# Patient Record
Sex: Female | Born: 1985 | Race: Black or African American | Hispanic: No | Marital: Single | Smoking: Current every day smoker
Health system: Southern US, Community
[De-identification: ages and names within clinical notes are randomized; demographics above are authoritative.]

## PROBLEM LIST (undated history)

## (undated) DIAGNOSIS — G43909 Migraine, unspecified, not intractable, without status migrainosus: Secondary | ICD-10-CM

---
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
## 2007-06-16 ENCOUNTER — Emergency Department (HOSPITAL_COMMUNITY): Admission: EM | Admit: 2007-06-16 | Discharge: 2007-06-16 | Payer: Self-pay | Admitting: Emergency Medicine

## 2007-08-04 ENCOUNTER — Emergency Department (HOSPITAL_COMMUNITY): Admission: EM | Admit: 2007-08-04 | Discharge: 2007-08-04 | Payer: Self-pay | Admitting: Emergency Medicine

## 2009-05-29 ENCOUNTER — Emergency Department (HOSPITAL_COMMUNITY): Admission: EM | Admit: 2009-05-29 | Discharge: 2009-05-29 | Payer: Self-pay | Admitting: Emergency Medicine

## 2010-03-08 ENCOUNTER — Encounter: Payer: Self-pay | Admitting: Obstetrics & Gynecology

## 2010-03-08 ENCOUNTER — Ambulatory Visit: Admit: 2010-03-08 | Payer: Self-pay | Admitting: Obstetrics & Gynecology

## 2010-09-02 ENCOUNTER — Emergency Department (HOSPITAL_COMMUNITY)
Admission: EM | Admit: 2010-09-02 | Discharge: 2010-09-02 | Disposition: A | Discharge status: Home / Self Care | Payer: Self-pay | Attending: Emergency Medicine | Admitting: Emergency Medicine

## 2010-09-02 ENCOUNTER — Emergency Department (HOSPITAL_COMMUNITY): Payer: Self-pay

## 2010-09-02 DIAGNOSIS — R05 Cough: Secondary | ICD-10-CM | POA: Insufficient documentation

## 2010-09-02 DIAGNOSIS — R059 Cough, unspecified: Secondary | ICD-10-CM | POA: Insufficient documentation

## 2010-09-02 DIAGNOSIS — R109 Unspecified abdominal pain: Secondary | ICD-10-CM | POA: Insufficient documentation

## 2010-09-02 DIAGNOSIS — N390 Urinary tract infection, site not specified: Secondary | ICD-10-CM | POA: Insufficient documentation

## 2010-09-02 DIAGNOSIS — R0602 Shortness of breath: Secondary | ICD-10-CM | POA: Insufficient documentation

## 2010-09-02 LAB — URINE MICROSCOPIC-ADD ON

## 2010-09-02 LAB — CBC
MCV: 87 fL (ref 78.0–100.0)
Platelets: 352 10*3/uL (ref 150–400)
RBC: 4.68 MIL/uL (ref 3.87–5.11)
RDW: 13.5 % (ref 11.5–15.5)
WBC: 13.8 10*3/uL — ABNORMAL HIGH (ref 4.0–10.5)

## 2010-09-02 LAB — URINALYSIS, ROUTINE W REFLEX MICROSCOPIC
Protein, ur: NEGATIVE mg/dL
Urobilinogen, UA: 1 mg/dL (ref 0.0–1.0)

## 2010-09-03 LAB — URINE CULTURE
Colony Count: 9000
Culture  Setup Time: 201207291757

## 2010-10-06 ENCOUNTER — Emergency Department (HOSPITAL_COMMUNITY)
Admission: EM | Admit: 2010-10-06 | Discharge: 2010-10-06 | Disposition: A | Discharge status: Home / Self Care | Payer: Self-pay | Attending: Emergency Medicine | Admitting: Emergency Medicine

## 2010-10-06 ENCOUNTER — Emergency Department (HOSPITAL_COMMUNITY): Payer: Self-pay

## 2010-10-06 DIAGNOSIS — M79609 Pain in unspecified limb: Secondary | ICD-10-CM | POA: Insufficient documentation

## 2010-10-06 DIAGNOSIS — IMO0002 Reserved for concepts with insufficient information to code with codable children: Secondary | ICD-10-CM | POA: Insufficient documentation

## 2010-10-06 DIAGNOSIS — S92309A Fracture of unspecified metatarsal bone(s), unspecified foot, initial encounter for closed fracture: Secondary | ICD-10-CM | POA: Insufficient documentation

## 2010-10-06 DIAGNOSIS — S9030XA Contusion of unspecified foot, initial encounter: Secondary | ICD-10-CM | POA: Insufficient documentation

## 2015-04-15 ENCOUNTER — Encounter (HOSPITAL_COMMUNITY): Payer: Self-pay | Admitting: Adult Health

## 2015-04-15 ENCOUNTER — Emergency Department (HOSPITAL_COMMUNITY)
Admission: EM | Admit: 2015-04-15 | Discharge: 2015-04-15 | Disposition: A | Discharge status: Home / Self Care | Payer: Managed Care, Other (non HMO) | Attending: Emergency Medicine | Admitting: Emergency Medicine

## 2015-04-15 DIAGNOSIS — K1379 Other lesions of oral mucosa: Secondary | ICD-10-CM | POA: Insufficient documentation

## 2015-04-15 DIAGNOSIS — J029 Acute pharyngitis, unspecified: Secondary | ICD-10-CM | POA: Diagnosis not present

## 2015-04-15 DIAGNOSIS — K1370 Unspecified lesions of oral mucosa: Secondary | ICD-10-CM

## 2015-04-15 DIAGNOSIS — R600 Localized edema: Secondary | ICD-10-CM | POA: Insufficient documentation

## 2015-04-15 LAB — RAPID STREP SCREEN (MED CTR MEBANE ONLY): Streptococcus, Group A Screen (Direct): NEGATIVE

## 2015-04-15 MED ORDER — ACYCLOVIR 400 MG PO TABS: 400.0000 mg | ORAL_TABLET | Freq: Three times a day (TID) | ORAL | Status: DC

## 2015-04-15 NOTE — ED Notes (Signed)
Presents with sore throat, pain in lips and sores on inside of lips began today-took excedrin for pain with relief. 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 I also rubbed some alcohol on my lips to kill the germs"

## 2015-04-15 NOTE — ED Provider Notes (Signed)
CSN: [id number]     Arrival date & time 04/15/15  1859 History  By signing my name below, I, Bethel Born, attest that this documentation has been prepared under the direction and in the presence of Isel Skufca PA-C. Electronically Signed: Bethel Born, ED Scribe. 04/15/2015 8:49 PM    Chief Complaint  Patient presents with  . Sore Throat   The history is provided by the patient. No language interpreter was used.   Sonya Ruiz is a 30 y.o. female who presents to the Emergency Department complaining of a constant bilateral sore throat with onset this morning upon waking. The pain is worse with swallowing. Associated symptoms include subjective fever, chills, lip swelling, burning lip lesions, and hoarse voice. She reports clusters of small bumps at the corners of her mouth and sensation of ulcers on the inside of her lips. She states "you can't see them but I can feel they are there". States that she put rubbing alcohol on her lips with some relief in swelling. She denies difficulty swallowing or handling secretions. She Googled her symptoms and is concerned for HSV since she performed unprotected oral sex two weeks ago. Her partner denies having Herpes. No known sick contacts with similar symptoms. She does not want other STD testing today. Denies abdominal pain, nausea, vomiting, vaginal discharge or dysuria.   History reviewed. No pertinent past medical history. History reviewed. No pertinent past surgical history. History reviewed. No pertinent family history. Social History  Substance Use Topics  . Smoking status: Never Smoker   . Smokeless tobacco: None  . Alcohol Use: Yes   OB History    No data available     Review of Systems  Constitutional: Positive for fever (subjective) and chills.  HENT: Positive for mouth sores, sore throat and voice change.        Lip swelling  All other systems reviewed and are negative.   Allergies  Latex  Home Medications   Prior  to Admission medications   Medication Sig Start Date End Date Taking? Authorizing Provider  acyclovir (ZOVIRAX) 400 MG tablet Take 1 tablet (400 mg total) by mouth 3 (three) times daily. 04/15/15   Domnique Vantine, PA-C   BP 124/72 mmHg  Pulse 82  Temp(Src) 98.6 F (37 C) (Oral)  Resp 16  Ht  (1.651 m)  Wt 135 lb 8 oz (61.462 kg)  BMI 22.55 kg/m2  SpO2 100% Physical Exam  Constitutional: She appears well-developed and well-nourished. No distress.  Nontoxic appearing.   HENT:  Head: Normocephalic and atraumatic.  Right Ear: External ear normal.  Left Ear: External ear normal.  Mouth/Throat: Uvula is midline, oropharynx is clear and moist and mucous membranes are normal. Mucous membranes are not dry. No uvula swelling. No oropharyngeal exudate, posterior oropharyngeal edema or posterior oropharyngeal erythema.  No obvious swelling noted to lips. Pt indicates an area of small, papular lesions to the left corner of her mouth. Lesions are pinpoint without vesicles, erythema, crusting, or desquamation. No oropharyngeal erythema or exudate.   Eyes: Conjunctivae are normal. Right eye exhibits no discharge. Left eye exhibits no discharge. No scleral icterus.  Neck: Normal range of motion.  Cardiovascular: Normal rate.   Pulmonary/Chest: Effort normal.  Musculoskeletal: Normal range of motion.  Moves all extremities spontaneously  Lymphadenopathy:    She has no cervical adenopathy.  Neurological: She is alert. Coordination normal.  Skin: Skin is warm and dry.  Psychiatric: She has a normal mood and affect. Her behavior is  normal.  Nursing note and vitals reviewed.   ED Course  Procedures (including critical care time) DIAGNOSTIC STUDIES: Oxygen Saturation is 100% on RA,  normal by my interpretation.    COORDINATION OF CARE: 8:46 PM Discussed treatment plan which includes rapid strep screen and empiric STD treatment with pt at bedside and pt agreed to plan.  Labs Review Labs  Reviewed  RAPID STREP SCREEN (NOT AT [facility])  CULTURE, GROUP A STREP [facility])    Imaging Review No results found. I have personally reviewed and evaluated these lab results as part of my medical decision-making.   EKG Interpretation None      MDM   Final diagnoses:  Sore throat  Oral lesion   30 year old female presenting with sore throat, lip burning and lip lesions 1 day. Unprotected oral intercourse 2 weeks ago. Afebrile and nontoxic appearing. Small area of clustered, pinpoint papules noted to left corner of mouth. No vesicles, pustules, erythema, crusting or skin breakdown noted. Lesions do not seem to be consistent with HSV but could be representation of early infection. No oropharyngeal erythema or exudate. No cervical lymphadenopathy. Patient tearful and extremely anxious; she states "I know I have herpes". Discussed with patient that we cannot perform a culture at this time as there are no vesicular lesions or fluid to obtain. Sexual encounter was 2 weeks ago and herpes testing is not indicated until 4-6 weeks after encounter. Will discharge with 7 day course of acyclovir and patient is to follow-up for further testing with the health Department. Long discussion about safe sex practices and using condoms with vaginal and oral intercourse. Return precautions given in discharge paperwork and discussed with pt at bedside. Pt stable for discharge   I personally performed the services described in this documentation, which was scribed in my presence. The recorded information has been reviewed and is accurate.    Rolm GalaStevi Quang Thorpe, PA-C 04/15/15 2124  Derwood KaplanAnkit Nanavati, MD 04/16/15 1524

## 2015-04-15 NOTE — Discharge Instructions (Signed)
Schedule a follow up appointment with the health department. Use condoms when sexually active.    Herpes Simplex Test There are two common types of herpes simplex virus (HSV). These are classified as Type 1 (HSV1) or Type 2 (HSV2). Type 1 often causes cold sores on or around the mouth and sometimes on or around the eyes. Type 2 is commonly known as a sexually transmitted infection that causes sores in and around the genitals. Both types of herpes simplex can cause sores in different areas. There are two types of herpes simplex tests. These include:  Culture. This consists of collecting and testing a sample of fluid with a cotton swab from an open sore. This can only be done during an active infection (outbreak). Culture tests take several days to complete but are very accurate.  HSV blood tests. This test is not as accurate as a culture. However, HSV blood tests are faster than cultures, often providing a test result within one day.  HSV antibody test. This checks for the presence of antibodies against HSV in your blood. Antibodies are proteins your body makes to help fight infection.  HSV antigen test. This checks for the presence of the HSV germ (antigen) in your blood. Your health care provider may recommend you have a HSV test if:  He or she believes you have a HSV infection.  You have a weakened immune system (immunocompromised) and you have sores around your mouth or genitals that look like HSV eruptions.  You have a fever of unknown origin (FUO).  You are pregnant, have herpes, and are expecting to deliver a baby vaginally in the next 6-8 weeks. RESULTS It is your responsibility to obtain your test results. Ask the lab or department performing the test when and how you will get your results. Contact your health care provider to discuss any questions you have about your results. Range of Normal Values Ranges for normal values may vary among different labs and hospitals. You should  always check with your health care provider after having lab work or other tests done to discuss whether your values are considered within normal limits. Normal findings include:  No HSV antigen or antibodies present in your blood.  No HSV antigen present in cultured fluid. Meaning of Results Outside Normal Ranges The following test results may indicate that you have an active HSV infection:  Positive culture for HSV1 or HSV2.  Presence of HSV1 or HSV2 antigens in your blood.  Presence of certain HSV1 or HSV2 antibodies (IgM) in your blood. Discuss your test results with your health care provider. He or she will use the results to make a diagnosis and determine a treatment plan that is right for you.   This information is not intended to replace advice given to you by your health care provider. Make sure you discuss any questions you have with your health care provider.   Document Released: 02/24/2004 Document Revised: 02/11/2014 Document Reviewed: 06/08/2013 Elsevier Interactive Patient Education 2016 Elsevier Inc. Sore Throat A sore throat is pain, burning, irritation, or scratchiness of the throat. There is often pain or tenderness when swallowing or talking. A sore throat may be accompanied by other symptoms, such as coughing, sneezing, fever, and swollen neck glands. A sore throat is often the first sign of another sickness, such as a cold, flu, strep throat, or mononucleosis (commonly known as mono). Most sore throats go away without medical treatment. CAUSES  The most common causes of a sore throat include:  A viral infection, such as a cold, flu, or mono.  A bacterial infection, such as strep throat, tonsillitis, or whooping cough.  Seasonal allergies.  Dryness in the air.  Irritants, such as smoke or pollution.  Gastroesophageal reflux disease (GERD). HOME CARE INSTRUCTIONS   Only take over-the-counter medicines as directed by your caregiver.  Drink enough fluids to  keep your urine clear or pale yellow.  Rest as needed.  Try using throat sprays, lozenges, or sucking on hard candy to ease any pain (if older than 4 years or as directed).  Sip warm liquids, such as broth, herbal tea, or warm water with honey to relieve pain temporarily. You may also eat or drink cold or frozen liquids such as frozen ice pops.  Gargle with salt water (mix 1 tsp salt with 8 oz of water).  Do not smoke and avoid secondhand smoke.  Put a cool-mist humidifier in your bedroom at night to moisten the air. You can also turn on a hot shower and sit in the bathroom with the door closed for 5-10 minutes. SEEK IMMEDIATE MEDICAL CARE IF:  You have difficulty breathing.  You are unable to swallow fluids, soft foods, or your saliva.  You have increased swelling in the throat.  Your sore throat does not get better in 7 days.  You have nausea and vomiting.  You have a fever or persistent symptoms for more than 2-3 days.  You have a fever and your symptoms suddenly get worse. MAKE SURE YOU:   Understand these instructions.  Will watch your condition.  Will get help right away if you are not doing well or get worse.   This information is not intended to replace advice given to you by your health care provider. Make sure you discuss any questions you have with your health care provider.   Document Released: 02/29/2004 Document Revised: 02/11/2014 Document Reviewed: 09/29/2011 Elsevier Interactive Patient Education [employer] Elsevier Inc.

## 2015-04-18 LAB — CULTURE, GROUP A STREP (THRC)

## 2015-12-04 ENCOUNTER — Encounter (HOSPITAL_COMMUNITY): Payer: Self-pay

## 2015-12-04 ENCOUNTER — Emergency Department (HOSPITAL_COMMUNITY)
Admission: EM | Admit: 2015-12-04 | Discharge: 2015-12-05 | Disposition: A | Discharge status: Home / Self Care | Payer: Managed Care, Other (non HMO) | Attending: Emergency Medicine | Admitting: Emergency Medicine

## 2015-12-04 DIAGNOSIS — Z7982 Long term (current) use of aspirin: Secondary | ICD-10-CM | POA: Diagnosis not present

## 2015-12-04 DIAGNOSIS — G8929 Other chronic pain: Secondary | ICD-10-CM | POA: Insufficient documentation

## 2015-12-04 DIAGNOSIS — Z91040 Latex allergy status: Secondary | ICD-10-CM | POA: Diagnosis not present

## 2015-12-04 DIAGNOSIS — M545 Low back pain: Secondary | ICD-10-CM | POA: Diagnosis present

## 2015-12-04 LAB — URINALYSIS, ROUTINE W REFLEX MICROSCOPIC
Bilirubin Urine: NEGATIVE
Glucose, UA: NEGATIVE mg/dL
Hgb urine dipstick: NEGATIVE
Ketones, ur: NEGATIVE mg/dL
LEUKOCYTES UA: NEGATIVE
NITRITE: NEGATIVE
PROTEIN: NEGATIVE mg/dL
Specific Gravity, Urine: 1.014 (ref 1.005–1.030)
pH: 6.5 (ref 5.0–8.0)

## 2015-12-04 NOTE — ED Triage Notes (Signed)
Per pT, Pt has had intermittent bilateral back pain that started about two months ago. Pt reports worsening today. Denies urinary symptoms. Denies N/V/D

## 2015-12-04 NOTE — ED Provider Notes (Signed)
[facility]-EMERGENCY DEPT Provider Note   CSN: [id number] Arrival date & time: 12/04/15  1836     History   Chief Complaint Chief Complaint  Patient presents with  . Back Pain    HPI Sonya Ruiz is a 30 y.o. female.  HPI   Sonya Ruiz is a 30 y.o. female, patient with no pertinent past medical history, presenting to the ED with bilateral lower back pain for the last 2 months. Pain is aching, mild, nonradiating. Pt has not tried any medications prior to arrival. Denies changes in bowel or bladder functions, neuro deficits, dysuria, or any other complaints.      History reviewed. No pertinent past medical history.  There are no active problems to display for this patient.   History reviewed. No pertinent surgical history.  OB History    No data available       Home Medications    Prior to Admission medications   Medication Sig Start Date End Date Taking? Authorizing Provider  Aspirin-Acetaminophen-Caffeine (EXCEDRIN EXTRA STRENGTH PO) Take 1 tablet by mouth every 6 (six) hours as needed (pain/ headache).   Yes Historical Provider, MD  Multiple Vitamin (MULTIVITAMIN WITH MINERALS) TABS tablet Take 1 tablet by mouth every evening.   Yes Historical Provider, MD  Tetrahydrozoline HCl (VISINE OP) Place 1 drop into both eyes 2 (two) times daily as needed (irritation/ dust).   Yes Historical Provider, MD  acyclovir (ZOVIRAX) 400 MG tablet Take 1 tablet (400 mg total) by mouth 3 (three) times daily. Patient not taking: Reported on 12/04/2015 04/15/15   Stevi Barrett, PA-C  lidocaine (LIDODERM) 5 % Place 1 patch onto the skin daily. Remove & Discard patch within 12 hours or as directed by MD 12/05/15   Anselm PancoastShawn C Joy, PA-C  methocarbamol (ROBAXIN) 500 MG tablet Take 1 tablet (500 mg total) by mouth 2 (two) times daily. 12/05/15   Anselm PancoastShawn C Joy, PA-C    Family History No family history on file.  Social History Social History  Substance Use Topics  . Smoking status: Never  Smoker  . Smokeless tobacco: Never Used  . Alcohol use Yes     Comment: Occasionally      Allergies   Muscle rub [camphor-menthol-methyl sal] and Latex   Review of Systems Review of Systems  Constitutional: Negative for chills and fever.  Respiratory: Negative for shortness of breath.   Musculoskeletal: Positive for back pain. Negative for neck pain.  Neurological: Negative for weakness and numbness.     Physical Exam Updated Vital Signs BP 131/76 (BP Location: Right Arm)   Pulse 86   Temp 98.3 F (36.8 C) (Oral)   Resp 16   Ht 5\' 5"  (1.651 m)   Wt 62.1 kg   SpO2 100%   BMI 22.80 kg/m   Physical Exam  Constitutional: She appears well-developed and well-nourished. No distress.  HENT:  Head: Normocephalic and atraumatic.  Eyes: Conjunctivae are normal.  Neck: Neck supple.  Cardiovascular: Normal rate and regular rhythm.   Pulmonary/Chest: Effort normal.  Musculoskeletal:  Tenderness to bilateral lumbar musculature. Full ROM in all extremities and spine. No midline spinal tenderness.   Neurological: She is alert.  No sensory deficits. Strength 5/5 in all extremities. No gait disturbance. Coordination intact.   Skin: Skin is warm and dry. She is not diaphoretic.  Psychiatric: She has a normal mood and affect. Her behavior is normal.  Nursing note and vitals reviewed.    ED Treatments / Results  Labs (all  labs ordered are listed, but only abnormal results are displayed) Labs Reviewed  URINALYSIS, ROUTINE W REFLEX MICROSCOPIC (NOT AT [facility])    EKG  EKG Interpretation None       Radiology No results found.  Procedures Procedures (including critical care time)  Medications Ordered in ED Medications - No data to display   Initial Impression / Assessment and Plan / ED Course  I have reviewed the triage vital signs and the nursing notes.  Pertinent labs & imaging results that were available during my care of the patient were reviewed by me and  considered in my medical decision making (see chart for details).  Clinical Course    Patient presents with lower back pain for the last two months. No neuro or functional deficits. No UTI or other urinary abnormality. PCP follow up. The patient was given instructions for home care as well as return precautions. Patient voices understanding of these instructions, accepts the plan, and is comfortable with discharge.  Vitals:   12/04/15 1854 12/04/15 1855 12/05/15 0026  BP: 131/76  101/61  Pulse: 86  71  Resp: 16  16  Temp: 98.3 F (36.8 C)    TempSrc: Oral    SpO2: 100%  100%  Weight:  62.1 kg   Height:  5\' 5"  (1.651 m)       Final Clinical Impressions(s) / ED Diagnoses   Final diagnoses:  Chronic bilateral low back pain without sciatica    New Prescriptions Discharge Medication List as of 12/05/2015 12:19 AM    START taking these medications   Details  lidocaine (LIDODERM) 5 % Place 1 patch onto the skin daily. Remove & Discard patch within 12 hours or as directed by MD, Starting Tue 12/05/2015, Print    methocarbamol (ROBAXIN) 500 MG tablet Take 1 tablet (500 mg total) by mouth 2 (two) times daily., Starting Tue 12/05/2015, Print         Anselm PancoastShawn C Joy, PA-C 12/05/15 [id number]    Azalia BilisKevin Campos, MD 12/05/15 [phone]

## 2015-12-05 MED ORDER — METHOCARBAMOL 500 MG PO TABS: 500.0000 mg | ORAL_TABLET | Freq: Two times a day (BID) | ORAL | 0 refills | Status: AC

## 2015-12-05 MED ORDER — LIDOCAINE 5 % EX PTCH: 1.0000 | MEDICATED_PATCH | CUTANEOUS | 0 refills | Status: AC

## 2015-12-05 NOTE — Discharge Instructions (Signed)
Take it easy, but do not lay around too much as this may make the stiffness worse. Take 500 mg of naproxen every 12 hours or 800 mg of ibuprofen every 8 hours for the next 3 days. Take these medications with food to avoid upset stomach. Robaxin is a muscle relaxer and may help loosen stiff muscles. Do not take the Robaxin while driving or performing other dangerous activities. ° °Follow-up with a primary care provider for chronic management of this issue. °

## 2016-05-07 ENCOUNTER — Encounter (HOSPITAL_COMMUNITY): Payer: Self-pay

## 2016-05-07 ENCOUNTER — Emergency Department (HOSPITAL_COMMUNITY)
Admission: EM | Admit: 2016-05-07 | Discharge: 2016-05-07 | Disposition: A | Discharge status: Home / Self Care | Payer: Managed Care, Other (non HMO) | Attending: Dermatology | Admitting: Dermatology

## 2016-05-07 DIAGNOSIS — Z5321 Procedure and treatment not carried out due to patient leaving prior to being seen by health care provider: Secondary | ICD-10-CM | POA: Insufficient documentation

## 2016-05-07 DIAGNOSIS — R51 Headache: Secondary | ICD-10-CM | POA: Insufficient documentation

## 2016-05-07 NOTE — ED Triage Notes (Signed)
Pt c/o of throbbing headache. Is in tears in triage.

## 2016-05-07 NOTE — ED Triage Notes (Signed)
Per EMS. Pt from work. Began to have a posterior HA this evening. Took 2 aleve with minimal relief. Hx of migraine, but normally pain is on front of head.

## 2016-05-07 NOTE — ED Notes (Addendum)
PT LEFT W/O BEING SEEN BY A PROVIDER, AND W/O SIGNING THE LWBS FORM.

## 2016-05-08 ENCOUNTER — Emergency Department
Admission: EM | Admit: 2016-05-08 | Discharge: 2016-05-08 | Disposition: A | Discharge status: Home / Self Care | Payer: Managed Care, Other (non HMO) | Attending: Student in an Organized Health Care Education/Training Program | Admitting: Student in an Organized Health Care Education/Training Program

## 2016-05-08 ENCOUNTER — Encounter: Payer: Self-pay | Admitting: Emergency Medicine

## 2016-05-08 DIAGNOSIS — G43009 Migraine without aura, not intractable, without status migrainosus: Secondary | ICD-10-CM | POA: Insufficient documentation

## 2016-05-08 DIAGNOSIS — Z79899 Other long term (current) drug therapy: Secondary | ICD-10-CM | POA: Diagnosis not present

## 2016-05-08 DIAGNOSIS — R51 Headache: Secondary | ICD-10-CM | POA: Diagnosis present

## 2016-05-08 LAB — POCT PREGNANCY, URINE: PREG TEST UR: NEGATIVE

## 2016-05-08 MED ORDER — KETOROLAC TROMETHAMINE 30 MG/ML IJ SOLN
30.0000 mg | Freq: Once | INTRAMUSCULAR | Status: AC
  Administered 2016-05-08: 30 mg via INTRAMUSCULAR
  Filled 2016-05-08: qty 1

## 2016-05-08 MED ORDER — DIPHENHYDRAMINE HCL 50 MG/ML IJ SOLN
50.0000 mg | Freq: Once | INTRAMUSCULAR | Status: AC
  Administered 2016-05-08: 50 mg via INTRAMUSCULAR
  Filled 2016-05-08 (×2): qty 1

## 2016-05-08 MED ORDER — PROMETHAZINE HCL 25 MG/ML IJ SOLN
25.0000 mg | Freq: Once | INTRAMUSCULAR | Status: AC
  Administered 2016-05-08: 25 mg via INTRAMUSCULAR
  Filled 2016-05-08 (×2): qty 1

## 2016-05-08 MED ORDER — KETOROLAC TROMETHAMINE 60 MG/2ML IM SOLN
INTRAMUSCULAR | Status: AC
  Filled 2016-05-08: qty 2

## 2016-05-08 NOTE — ED Provider Notes (Signed)
[facility] Emergency Department Provider Note  ____________________________________________  Time seen: Approximately 6:40 PM  I have reviewed the triage vital signs and the nursing notes.   HISTORY  Chief Complaint Headache    HPI Sonya Ruiz is a 31 y.o. female who presents emergency department complaining of recurrent migraine headache. Patient reports that she has occasional migraines and developed while in the past 2 days. Patient reports that she has taken multiple over-the-counter medications without complete relief of her headache. Patient reports that at its peak severity, she had some mild blurred vision. This consists consistent with previous migraines. Patient denies any neck pain, neck stiffness, fevers or chills, chest pain, shortness of breath, nausea or vomiting. Patient denies any photophobia or phonophobia at this time.   History reviewed. No pertinent past medical history.  There are no active problems to display for this patient.   History reviewed. No pertinent surgical history.  Prior to Admission medications   Medication Sig Start Date End Date Taking? Authorizing Provider  acyclovir (ZOVIRAX) 400 MG tablet Take 1 tablet (400 mg total) by mouth 3 (three) times daily. Patient not taking: Reported on 12/04/2015 04/15/15   Rolm Gala Barrett, PA-C  Aspirin-Acetaminophen-Caffeine (EXCEDRIN EXTRA STRENGTH PO) Take 1 tablet by mouth every 6 (six) hours as needed (pain/ headache).    Historical Provider, MD  lidocaine (LIDODERM) 5 % Place 1 patch onto the skin daily. Remove & Discard patch within 12 hours or as directed by MD 12/05/15   Anselm Pancoast, PA-C  methocarbamol (ROBAXIN) 500 MG tablet Take 1 tablet (500 mg total) by mouth 2 (two) times daily. 12/05/15   Shawn C Joy, PA-C  Multiple Vitamin (MULTIVITAMIN WITH MINERALS) TABS tablet Take 1 tablet by mouth every evening.    Historical Provider, MD  Tetrahydrozoline HCl (VISINE OP) Place 1  drop into both eyes 2 (two) times daily as needed (irritation/ dust).    Historical Provider, MD    Allergies Muscle rub [camphor-menthol-methyl sal] and Latex  No family history on file.  Social History Social History  Substance Use Topics  . Smoking status: Never Smoker  . Smokeless tobacco: Never Used  . Alcohol use Yes     Comment: Occasionally      Review of Systems  Constitutional: No fever/chills Eyes: No visual changes.  ENT: No upper respiratory complaints. Cardiovascular: no chest pain. Respiratory: no cough. No SOB. Gastrointestinal: No abdominal pain.  No nausea, no vomiting.   Musculoskeletal: Negative for musculoskeletal pain. Skin: Negative for rash, abrasions, lacerations, ecchymosis. Neurological: Positive for headache but denies focal weakness or numbness. 10-point ROS otherwise negative.  ____________________________________________   PHYSICAL EXAM:  VITAL SIGNS: ED Triage Vitals [05/08/16 1821]  Enc Vitals Group     BP 121/78     Pulse Rate 88     Resp 17     Temp 98 F (36.7 C)     Temp Source Oral     SpO2 97 %     Weight 140 lb (63.5 kg)     Height  (1.626 m)     Head Circumference      Peak Flow      Pain Score 7     Pain Loc      Pain Edu?      Excl. in GC?      Constitutional: Alert and oriented. Well appearing and in no acute distress. Eyes: Conjunctivae are normal. PERRL. EOMI. Head: Atraumatic. Neck: No stridor. Neck is supple  with full range of motion  Cardiovascular: Normal rate, regular rhythm. Normal S1 and S2.  Good peripheral circulation. Respiratory: Normal respiratory effort without tachypnea or retractions. Lungs CTAB. Good air entry to the bases with no decreased or absent breath sounds. Musculoskeletal: Full range of motion to all extremities. No gross deformities appreciated. Neurologic:  Normal speech and language. No gross focal neurologic deficits are appreciated. Cranial nerves II through XII are grossly  intact. Skin:  Skin is warm, dry and intact. No rash noted. Psychiatric: Mood and affect are normal. Speech and behavior are normal. Patient exhibits appropriate insight and judgement.   ____________________________________________   LABS (all labs ordered are listed, but only abnormal results are displayed)  Labs Reviewed  POC URINE PREG, ED  POCT PREGNANCY, URINE   ____________________________________________  EKG   ____________________________________________  RADIOLOGY   No results found.  ____________________________________________    PROCEDURES  Procedure(s) performed:    Procedures    Medications  ketorolac (TORADOL) 30 MG/ML injection 30 mg (not administered)  promethazine (PHENERGAN) injection 25 mg (not administered)  diphenhydrAMINE (BENADRYL) injection 50 mg (not administered)     ____________________________________________   INITIAL IMPRESSION / ASSESSMENT AND PLAN / ED COURSE  Pertinent labs & imaging results that were available during my care of the patient were reviewed by me and considered in my medical decision making (see chart for details).  Review of the [place] CSRS was performed in accordance of the [facility] prior to dispensing any controlled drugs.     Patient's diagnosis is consistent with Migraine headache. At this time, no concerning neurological findings. Exam was benign. No indication for imaging at this time. Patient is given migraine cocktail in the emergency department. Patient will take Tylenol and Motrin at home as needed. Patient will follow-up with primary care as needed..Patient is given ED precautions to return to the ED for any worsening or new symptoms.     ____________________________________________  FINAL CLINICAL IMPRESSION(S) / ED DIAGNOSES  Final diagnoses:  Migraine without aura and without status migrainosus, not intractable      NEW MEDICATIONS STARTED DURING THIS VISIT:  New Prescriptions   No  medications on file        This chart was dictated using voice recognition software/Dragon. Despite best efforts to proofread, errors can occur which can change the meaning. Any change was purely unintentional.    Racheal Patches, PA-C 05/08/16 1850    Willy Eddy, MD 05/08/16 1859

## 2016-05-08 NOTE — ED Triage Notes (Signed)
Patient presents to ED via POV with c/o throbbing HA to the back of the head. Denies injury. Seen yesterday for same at [facility] long but left before being seen. Denies N/V, dizziness or light sensitivity.

## 2016-05-08 NOTE — ED Notes (Signed)
Pt discharged to home.  Family member driving.  Discharge instructions reviewed.  Verbalized understanding.  No questions or concerns at this time.  Teach back verified.  Pt in NAD.  No items left in ED.   

## 2016-05-08 NOTE — ED Notes (Signed)
Spoke to Dr. Roxan Hockey in regards to patient presentation. See orders.

## 2016-05-14 ENCOUNTER — Encounter: Payer: Self-pay | Admitting: Emergency Medicine

## 2016-05-14 ENCOUNTER — Emergency Department
Admission: EM | Admit: 2016-05-14 | Discharge: 2016-05-14 | Disposition: A | Discharge status: Home / Self Care | Payer: Managed Care, Other (non HMO) | Attending: Student in an Organized Health Care Education/Training Program | Admitting: Student in an Organized Health Care Education/Training Program

## 2016-05-14 DIAGNOSIS — F17210 Nicotine dependence, cigarettes, uncomplicated: Secondary | ICD-10-CM | POA: Insufficient documentation

## 2016-05-14 DIAGNOSIS — Z79899 Other long term (current) drug therapy: Secondary | ICD-10-CM | POA: Diagnosis not present

## 2016-05-14 DIAGNOSIS — R51 Headache: Secondary | ICD-10-CM | POA: Insufficient documentation

## 2016-05-14 DIAGNOSIS — R519 Headache, unspecified: Secondary | ICD-10-CM

## 2016-05-14 MED ORDER — BUTALBITAL-APAP-CAFFEINE 50-325-40 MG PO TABS: 2.0000 | ORAL_TABLET | Freq: Four times a day (QID) | ORAL | Status: DC | PRN

## 2016-05-14 MED ORDER — BUTALBITAL-APAP-CAFFEINE 50-325-40 MG PO TABS: 1.0000 | ORAL_TABLET | Freq: Four times a day (QID) | ORAL | 0 refills | Status: AC | PRN

## 2016-05-14 MED ORDER — DEXAMETHASONE 4 MG PO TABS
4.0000 mg | ORAL_TABLET | Freq: Once | ORAL | Status: AC
  Administered 2016-05-14: 4 mg via ORAL
  Filled 2016-05-14: qty 1

## 2016-05-14 NOTE — ED Notes (Signed)
Pt c/o headache for the last 3 weeks - pt states she was seen her Tuesday for the headache and it is still present - pt c/o nausea BUT she is eating potato chips without difficulty at this time - she reports that she had sensitivity to lights but that this has completely resolved

## 2016-05-14 NOTE — ED Triage Notes (Signed)
Pt presents to ED via EMS with c/o generalized headache for the past week. Pt has hx of migraines and states this is different. Seen in this ED about a week ago but did not have relief after medications were given. No sensitivity to sounds and light.

## 2016-05-14 NOTE — Discharge Instructions (Signed)

## 2016-05-14 NOTE — ED Notes (Signed)
Signature pad not working.  Patient verbalized understanding of discharge instructions and has no further questions. 

## 2016-05-14 NOTE — ED Provider Notes (Signed)
[facility] Emergency Department Provider Note    None    (approximate)  I have reviewed the triage vital signs and the nursing notes.   HISTORY  Chief Complaint Headache    HPI Sonya Ruiz is a 31 y.o. female presents with several weeks of bilateral headache that comes and goes. Patient states she has a history of migraine headaches but this one is different in that it is persisting for several weeks. In the ER several days ago and was given migraine cocktail with some improvement in symptoms but they have since returned. No fevers. States headache is worse while at work. Patient works as a Nature conservation officer at [employer]. Had some photophobia. No numbness or tingling. No trauma.   History reviewed. No pertinent past medical history. FMH: no bleeding disorders History reviewed. No pertinent surgical history. There are no active problems to display for this patient.     Prior to Admission medications   Medication Sig Start Date End Date Taking? Authorizing Provider  Aspirin-Acetaminophen-Caffeine (EXCEDRIN EXTRA STRENGTH PO) Take 1 tablet by mouth every 6 (six) hours as needed (pain/ headache).   Yes Historical Provider, MD  lidocaine (LIDODERM) 5 % Place 1 patch onto the skin daily. Remove & Discard patch within 12 hours or as directed by MD 12/05/15  Yes Shawn C Joy, PA-C  methocarbamol (ROBAXIN) 500 MG tablet Take 1 tablet (500 mg total) by mouth 2 (two) times daily. 12/05/15  Yes Shawn C Joy, PA-C  Multiple Vitamin (MULTIVITAMIN WITH MINERALS) TABS tablet Take 1 tablet by mouth every evening.   Yes Historical Provider, MD  Tetrahydrozoline HCl (VISINE OP) Place 1 drop into both eyes 2 (two) times daily as needed (irritation/ dust).   Yes Historical Provider, MD  acyclovir (ZOVIRAX) 400 MG tablet Take 1 tablet (400 mg total) by mouth 3 (three) times daily. Patient not taking: Reported on 12/04/2015 04/15/15   Alveta Heimlich, PA-C    butalbital-acetaminophen-caffeine (FIORICET, ESGIC) [phone] MG tablet Take 1-2 tablets by mouth every 6 (six) hours as needed for headache. 05/14/16 05/14/17  Willy Eddy, MD    Allergies Muscle rub [camphor-menthol-methyl sal] and Latex    Social History Social History  Substance Use Topics  . Smoking status: Current Every Day Smoker    Types: Cigarettes  . Smokeless tobacco: Never Used  . Alcohol use Yes     Comment: Occasionally     Review of Systems Patient denies headaches, rhinorrhea, blurry vision, numbness, shortness of breath, chest pain, edema, cough, abdominal pain, nausea, vomiting, diarrhea, dysuria, fevers, rashes or hallucinations unless otherwise stated above in HPI. ____________________________________________   PHYSICAL EXAM:  VITAL SIGNS: Vitals:   05/14/16 1935 05/14/16 2052  BP: 110/71 113/68  Pulse: 69 67  Resp: 18 14  Temp: 98.8 F (37.1 C)     Constitutional: Alert and oriented. Well appearing and in no acute distress. Eyes: Conjunctivae are normal. PERRL. EOMI. Head: Atraumatic. Nose: No congestion/rhinnorhea. Mouth/Throat: Mucous membranes are moist.  Oropharynx non-erythematous. Neck: No stridor. Painless ROM. No cervical spine tenderness to palpation Hematological/Lymphatic/Immunilogical: No cervical lymphadenopathy. Cardiovascular: Normal rate, regular rhythm. Grossly normal heart sounds.  Good peripheral circulation. Respiratory: Normal respiratory effort.  No retractions. Lungs CTAB. Gastrointestinal: Soft and nontender. No distention. No abdominal bruits. No CVA tenderness. Genitourinary:  Musculoskeletal: No lower extremity tenderness nor edema.  No joint effusions. Neurologic:  CN- intact.  No facial droop, Normal FNF.  Normal heel to shin.  Sensation intact bilaterally. Normal  speech and language. No gross focal neurologic deficits are appreciated. No gait instability.  Skin:  Skin is warm, dry and intact. No rash  noted. Psychiatric: Mood and affect are normal. Speech and behavior are normal.  ____________________________________________   LABS (all labs ordered are listed, but only abnormal results are displayed)  No results found for this or any previous visit (from the past 24 hour(s)). ____________________________________________  ____________________________________________  RADIOLOGY   ____________________________________________   PROCEDURES  Procedure(s) performed:  Procedures    Critical Care performed: no ____________________________________________   INITIAL IMPRESSION / ASSESSMENT AND PLAN / ED COURSE  Pertinent labs & imaging results that were available during my care of the patient were reviewed by me and considered in my medical decision making (see chart for details).  DDX: migraine, tension, meningitis, cluster  Sonya Ruiz is a 31 y.o. who presents to the ED with with Hx of migraines p/w HA for last 3 weeks. Not worst HA ever. Gradual onset.   Intermittent in nature. HA similar to previous episodes. Denies focal neurologic symptoms.  Concerned 2/2 prolonged duration of symptoms. Denies trauma. No fevers or neck pain. No vision loss. Afebrile in ED. VSS. Exam as above. No meningeal signs. No CN, motor, sensory or cerebellar deficits. Temporal arteries palpable and non-tender. Appears well and non-toxic.  Patient declined IV analgesics or fluids.   Likely tension, non-specific or possible migraine HA. Clinical picture is not consistent with ICH, SAH, SDH, EDH, TIA, or CVA. No concern for meningitis or encephalitis. No concern for GCA/Temporal arteritis. Repeat neuro exam is again without focal deficit, nuchal rigidity or evidence of meningeal irritation.  Stable to D/C home, follow up with PCP or Neurology if persistent recurrent Has.  Have discussed with the patient and available family all diagnostics and treatments performed thus far and all questions were answered  to the best of my ability. The patient demonstrates understanding and agreement with plan.       ____________________________________________   FINAL CLINICAL IMPRESSION(S) / ED DIAGNOSES  Final diagnoses:  Bad headache      NEW MEDICATIONS STARTED DURING THIS VISIT:  Discharge Medication List as of 05/14/2016  8:10 PM    START taking these medications   Details  butalbital-acetaminophen-caffeine (FIORICET, ESGIC) 50-325-40 MG tablet Take 1-2 tablets by mouth every 6 (six) hours as needed for headache., Starting Tue 05/14/2016, Until Wed 05/14/2017, Print         Note:  This document was prepared using Dragon voice recognition software and may include unintentional dictation errors.    Willy Eddy, MD 05/15/16 [phone]

## 2016-05-29 ENCOUNTER — Emergency Department (HOSPITAL_COMMUNITY)
Admission: EM | Admit: 2016-05-29 | Discharge: 2016-05-30 | Disposition: A | Discharge status: Home / Self Care | Payer: Managed Care, Other (non HMO) | Attending: Emergency Medicine | Admitting: Emergency Medicine

## 2016-05-29 ENCOUNTER — Encounter (HOSPITAL_COMMUNITY): Payer: Self-pay

## 2016-05-29 DIAGNOSIS — T43612A Poisoning by caffeine, intentional self-harm, initial encounter: Secondary | ICD-10-CM | POA: Insufficient documentation

## 2016-05-29 DIAGNOSIS — Z7982 Long term (current) use of aspirin: Secondary | ICD-10-CM | POA: Insufficient documentation

## 2016-05-29 DIAGNOSIS — Z91040 Latex allergy status: Secondary | ICD-10-CM | POA: Diagnosis not present

## 2016-05-29 DIAGNOSIS — Y939 Activity, unspecified: Secondary | ICD-10-CM | POA: Insufficient documentation

## 2016-05-29 DIAGNOSIS — F17210 Nicotine dependence, cigarettes, uncomplicated: Secondary | ICD-10-CM | POA: Diagnosis not present

## 2016-05-29 DIAGNOSIS — F1099 Alcohol use, unspecified with unspecified alcohol-induced disorder: Secondary | ICD-10-CM | POA: Diagnosis not present

## 2016-05-29 DIAGNOSIS — F4325 Adjustment disorder with mixed disturbance of emotions and conduct: Secondary | ICD-10-CM | POA: Insufficient documentation

## 2016-05-29 DIAGNOSIS — W2201XA Walked into wall, initial encounter: Secondary | ICD-10-CM | POA: Diagnosis not present

## 2016-05-29 DIAGNOSIS — Y999 Unspecified external cause status: Secondary | ICD-10-CM | POA: Diagnosis not present

## 2016-05-29 DIAGNOSIS — T423X2A Poisoning by barbiturates, intentional self-harm, initial encounter: Secondary | ICD-10-CM | POA: Diagnosis not present

## 2016-05-29 DIAGNOSIS — Y929 Unspecified place or not applicable: Secondary | ICD-10-CM | POA: Insufficient documentation

## 2016-05-29 DIAGNOSIS — S6991XA Unspecified injury of right wrist, hand and finger(s), initial encounter: Secondary | ICD-10-CM | POA: Diagnosis present

## 2016-05-29 DIAGNOSIS — Z79899 Other long term (current) drug therapy: Secondary | ICD-10-CM | POA: Diagnosis not present

## 2016-05-29 DIAGNOSIS — S52224A Nondisplaced transverse fracture of shaft of right ulna, initial encounter for closed fracture: Secondary | ICD-10-CM | POA: Insufficient documentation

## 2016-05-29 DIAGNOSIS — T391X2A Poisoning by 4-Aminophenol derivatives, intentional self-harm, initial encounter: Secondary | ICD-10-CM | POA: Diagnosis not present

## 2016-05-29 DIAGNOSIS — R45851 Suicidal ideations: Secondary | ICD-10-CM

## 2016-05-29 HISTORY — DX: Migraine, unspecified, not intractable, without status migrainosus: G43.909

## 2016-05-29 LAB — CBC WITH DIFFERENTIAL/PLATELET
BASOS ABS: 0 10*3/uL (ref 0.0–0.1)
Basophils Relative: 0 %
Eosinophils Absolute: 0 10*3/uL (ref 0.0–0.7)
Eosinophils Relative: 0 %
HEMATOCRIT: 39.5 % (ref 36.0–46.0)
Hemoglobin: 13.1 g/dL (ref 12.0–15.0)
Lymphocytes Relative: 31 %
Lymphs Abs: 1.8 10*3/uL (ref 0.7–4.0)
MCH: 28.4 pg (ref 26.0–34.0)
MCHC: 33.2 g/dL (ref 30.0–36.0)
MCV: 85.7 fL (ref 78.0–100.0)
MONO ABS: 0.4 10*3/uL (ref 0.1–1.0)
MONOS PCT: 6 %
NEUTROS ABS: 3.7 10*3/uL (ref 1.7–7.7)
Neutrophils Relative %: 63 %
Platelets: 188 10*3/uL (ref 150–400)
RBC: 4.61 MIL/uL (ref 3.87–5.11)
RDW: 17.3 % — AB (ref 11.5–15.5)
WBC: 5.9 10*3/uL (ref 4.0–10.5)

## 2016-05-29 LAB — BLOOD GAS, VENOUS
Acid-base deficit: 3.6 mmol/L — ABNORMAL HIGH (ref 0.0–2.0)
Bicarbonate: 18.1 mmol/L — ABNORMAL LOW (ref 20.0–28.0)
O2 SAT: 72.4 %
PH VEN: 7.469 — AB (ref 7.250–7.430)
PO2 VEN: 35.4 mmHg (ref 32.0–45.0)
Patient temperature: 98.6
pCO2, Ven: 25.3 mmHg — ABNORMAL LOW (ref 44.0–60.0)

## 2016-05-29 LAB — COMPREHENSIVE METABOLIC PANEL
ALK PHOS: 61 U/L (ref 38–126)
ALT: 19 U/L (ref 14–54)
AST: 35 U/L (ref 15–41)
Albumin: 4.4 g/dL (ref 3.5–5.0)
Anion gap: 11 (ref 5–15)
BUN: 9 mg/dL (ref 6–20)
CALCIUM: 9.2 mg/dL (ref 8.9–10.3)
CO2: 19 mmol/L — ABNORMAL LOW (ref 22–32)
CREATININE: 1.13 mg/dL — AB (ref 0.44–1.00)
Chloride: 108 mmol/L (ref 101–111)
Glucose, Bld: 89 mg/dL (ref 65–99)
Potassium: 3.8 mmol/L (ref 3.5–5.1)
Sodium: 138 mmol/L (ref 135–145)
Total Bilirubin: 0.9 mg/dL (ref 0.3–1.2)
Total Protein: 7.6 g/dL (ref 6.5–8.1)

## 2016-05-29 LAB — I-STAT CHEM 8, ED
BUN: 9 mg/dL (ref 6–20)
CALCIUM ION: 1.09 mmol/L — AB (ref 1.15–1.40)
CREATININE: 1 mg/dL (ref 0.44–1.00)
Chloride: 106 mmol/L (ref 101–111)
Glucose, Bld: 85 mg/dL (ref 65–99)
HCT: 42 % (ref 36.0–46.0)
Hemoglobin: 14.3 g/dL (ref 12.0–15.0)
Potassium: 4.4 mmol/L (ref 3.5–5.1)
Sodium: 140 mmol/L (ref 135–145)
TCO2: 21 mmol/L (ref 0–100)

## 2016-05-29 LAB — RAPID URINE DRUG SCREEN, HOSP PERFORMED
Amphetamines: NOT DETECTED
Barbiturates: POSITIVE — AB
Benzodiazepines: POSITIVE — AB
COCAINE: NOT DETECTED
OPIATES: NOT DETECTED
Tetrahydrocannabinol: POSITIVE — AB

## 2016-05-29 LAB — HEPATIC FUNCTION PANEL
ALK PHOS: 53 U/L (ref 38–126)
ALT: 17 U/L (ref 14–54)
AST: 22 U/L (ref 15–41)
Albumin: 3.7 g/dL (ref 3.5–5.0)
Total Bilirubin: 0.5 mg/dL (ref 0.3–1.2)
Total Protein: 6.4 g/dL — ABNORMAL LOW (ref 6.5–8.1)

## 2016-05-29 LAB — ACETAMINOPHEN LEVEL
Acetaminophen (Tylenol), Serum: 13 ug/mL (ref 10–30)
Acetaminophen (Tylenol), Serum: <10 ug/mL — ABNORMAL LOW (ref 10–30)

## 2016-05-29 LAB — URINALYSIS, ROUTINE W REFLEX MICROSCOPIC
BILIRUBIN URINE: NEGATIVE
Glucose, UA: NEGATIVE mg/dL
Hgb urine dipstick: NEGATIVE
KETONES UR: 20 mg/dL — AB
LEUKOCYTES UA: NEGATIVE
NITRITE: NEGATIVE
PH: 6 (ref 5.0–8.0)
PROTEIN: NEGATIVE mg/dL
Specific Gravity, Urine: 1.008 (ref 1.005–1.030)

## 2016-05-29 LAB — ETHANOL

## 2016-05-29 LAB — CBG MONITORING, ED: GLUCOSE-CAPILLARY: 87 mg/dL (ref 65–99)

## 2016-05-29 LAB — SALICYLATE LEVEL

## 2016-05-29 LAB — I-STAT BETA HCG BLOOD, ED (MC, WL, AP ONLY)

## 2016-05-29 MED ORDER — LORAZEPAM 2 MG/ML IJ SOLN
INTRAMUSCULAR | Status: AC
  Filled 2016-05-29: qty 1

## 2016-05-29 MED ORDER — LORAZEPAM 2 MG/ML IJ SOLN
2.0000 mg | Freq: Once | INTRAMUSCULAR | Status: AC
  Administered 2016-05-29: 2 mg via INTRAMUSCULAR
  Filled 2016-05-29: qty 1

## 2016-05-29 MED ORDER — SODIUM CHLORIDE 0.9 % IV SOLN
INTRAVENOUS | Status: DC
  Administered 2016-05-29: 125 mL/h via INTRAVENOUS

## 2016-05-29 MED ORDER — STERILE WATER FOR INJECTION IJ SOLN
INTRAMUSCULAR | Status: AC
  Administered 2016-05-29: 2 mL
  Filled 2016-05-29: qty 10

## 2016-05-29 MED ORDER — LORAZEPAM 1 MG PO TABS: 1.0000 mg | ORAL_TABLET | Freq: Three times a day (TID) | ORAL | Status: DC | PRN

## 2016-05-29 MED ORDER — ZIPRASIDONE MESYLATE 20 MG IM SOLR
INTRAMUSCULAR | Status: AC
  Administered 2016-05-29: 20 mg
  Filled 2016-05-29: qty 20

## 2016-05-29 MED ORDER — ACETAMINOPHEN 325 MG PO TABS: 650.0000 mg | ORAL_TABLET | Freq: Once | ORAL | Status: DC

## 2016-05-29 MED ORDER — DIPHENHYDRAMINE HCL 50 MG/ML IJ SOLN
50.0000 mg | Freq: Once | INTRAMUSCULAR | Status: AC
  Administered 2016-05-29: 50 mg via INTRAMUSCULAR
  Filled 2016-05-29: qty 1

## 2016-05-29 MED ORDER — ZIPRASIDONE MESYLATE 20 MG IM SOLR
20.0000 mg | Freq: Once | INTRAMUSCULAR | Status: AC
  Administered 2016-05-29: 20 mg via INTRAMUSCULAR

## 2016-05-29 MED ORDER — LORAZEPAM 2 MG/ML IJ SOLN
1.0000 mg | Freq: Once | INTRAMUSCULAR | Status: AC
  Administered 2016-05-29: 1 mg via INTRAVENOUS

## 2016-05-29 MED ORDER — ONDANSETRON HCL 4 MG PO TABS: 4.0000 mg | ORAL_TABLET | Freq: Three times a day (TID) | ORAL | Status: DC | PRN

## 2016-05-29 MED ORDER — ZIPRASIDONE MESYLATE 20 MG IM SOLR
20.0000 mg | Freq: Once | INTRAMUSCULAR | Status: DC
  Filled 2016-05-29: qty 20

## 2016-05-29 MED ORDER — STERILE WATER FOR INJECTION IJ SOLN
INTRAMUSCULAR | Status: AC
  Filled 2016-05-29: qty 10

## 2016-05-29 NOTE — ED Notes (Addendum)
Pt transferred from TCU to [facility] ambulatory, gait steady with assist of [employer] and Security.  Pt combative kicking at doors and punched  cabinets in room #34, breaking it.  Pt unredirectable.  PA Spencer called for medication orders.  Security and [employer] at bedside for assistance with med administration.  Pt placed in seclusion room for safety of pt and staff members.  Dr Corlis Leak at bedside to visualize pt.

## 2016-05-29 NOTE — ED Notes (Signed)
Transfer to [facility] cancelled, to pt go to [facility]

## 2016-05-29 NOTE — ED Notes (Signed)
Pt now yelling out demanding "I need a real doctor to come in this room, not a tech, not a nurse, a real doctor."

## 2016-05-29 NOTE — ED Notes (Signed)
Patient too sedated to ambulate to [facility]. Charge nurse notified. No open beds in

## 2016-05-29 NOTE — ED Notes (Signed)
Bed: [facility] Expected date:  Expected time:  Means of arrival:  Comments: Hold to move patient from 41

## 2016-05-29 NOTE — ED Notes (Signed)
Clydie Braun Kimber/mother [phone]      Raven/sister [phone]

## 2016-05-29 NOTE — ED Notes (Signed)
Patient states, "Ya'll better get nurses and whoever because I am about to fight and bite. I don't care if I go to jail."

## 2016-05-29 NOTE — ED Provider Notes (Signed)
[facility]-EMERGENCY DEPT Provider Note   CSN: [id number] Arrival date & time: 05/29/16  1055     History   Chief Complaint No chief complaint on file.   HPI Sonya Ruiz is a 31 y.o. female.  HPI Pt comes in with cc of overdose. LEVEL 5 CAVEAT FOR THE OVERDOSE. Per EMS- patient took an unknown amount of Butalb/Tylenol/Caffeine 50/325 mg last night-unknown time. There were 20 total tablets dispenses on 4/17. Intention appears to be suicide. Patient was combative at the scene and was given Versed 5 mg IV per EMS.  Pt is somnolent at arrival. Protecting airway.   Past Medical History:  Diagnosis Date  . Migraines     There are no active problems to display for this patient.   History reviewed. No pertinent surgical history.  OB History    No data available       Home Medications    Prior to Admission medications   Medication Sig Start Date End Date Taking? Authorizing Provider  acyclovir (ZOVIRAX) 400 MG tablet Take 1 tablet (400 mg total) by mouth 3 (three) times daily. Patient not taking: Reported on 12/04/2015 04/15/15   Rolm Gala Barrett, PA-C  Aspirin-Acetaminophen-Caffeine (EXCEDRIN EXTRA STRENGTH PO) Take 1 tablet by mouth every 6 (six) hours as needed (pain/ headache).    Historical Provider, MD  butalbital-acetaminophen-caffeine (FIORICET, ESGIC) [phone] MG tablet Take 1-2 tablets by mouth every 6 (six) hours as needed for headache. 05/14/16 05/14/17  Willy Eddy, MD  lidocaine (LIDODERM) 5 % Place 1 patch onto the skin daily. Remove & Discard patch within 12 hours or as directed by MD 12/05/15   Anselm Pancoast, PA-C  methocarbamol (ROBAXIN) 500 MG tablet Take 1 tablet (500 mg total) by mouth 2 (two) times daily. 12/05/15   Shawn C Joy, PA-C  Multiple Vitamin (MULTIVITAMIN WITH MINERALS) TABS tablet Take 1 tablet by mouth every evening.    Historical Provider, MD  Tetrahydrozoline HCl (VISINE OP) Place 1 drop into both eyes 2 (two) times daily as needed  (irritation/ dust).    Historical Provider, MD    Family History History reviewed. No pertinent family history.  Social History Social History  Substance Use Topics  . Smoking status: Current Every Day Smoker    Types: Cigarettes  . Smokeless tobacco: Never Used  . Alcohol use Yes     Comment: Occasionally      Allergies   Muscle rub [camphor-menthol-methyl sal] and Latex   Review of Systems Review of Systems  Unable to perform ROS: Mental status change     Physical Exam Updated Vital Signs BP 110/73   Pulse (!) 59   Resp (!) 26   Ht  (1.651 m)   Wt 137 lb (62.1 kg)   LMP 05/05/2016 (Approximate)   SpO2 100%   BMI 22.80 kg/m   Physical Exam  Constitutional: She appears well-developed.  HENT:  Head: Normocephalic and atraumatic.  Eyes: EOM are normal. Pupils are equal, round, and reactive to light.  2 mm and equal  Neck: Normal range of motion. Neck supple.  Cardiovascular:  tachycardia  Pulmonary/Chest: Effort normal and breath sounds normal.  Abdominal: Bowel sounds are normal.  Neurological:  Somnolent, GCS 4/3/5 - 12.  Skin: Skin is warm and dry. She is not diaphoretic.  Nursing note and vitals reviewed.    ED Treatments / Results  Labs (all labs ordered are listed, but only abnormal results are displayed) Labs Reviewed  COMPREHENSIVE METABOLIC PANEL - Abnormal; Notable  for the following:       Result Value   CO2 19 (*)    Creatinine, Ser 1.13 (*)    All other components within normal limits  RAPID URINE DRUG SCREEN, HOSP PERFORMED - Abnormal; Notable for the following:    Benzodiazepines POSITIVE (*)    Tetrahydrocannabinol POSITIVE (*)    Barbiturates POSITIVE (*)    All other components within normal limits  CBC WITH DIFFERENTIAL/PLATELET - Abnormal; Notable for the following:    RDW 17.3 (*)    All other components within normal limits  URINALYSIS, ROUTINE W REFLEX MICROSCOPIC - Abnormal; Notable for the following:    Color, Urine  STRAW (*)    Ketones, ur 20 (*)    All other components within normal limits  BLOOD GAS, VENOUS - Abnormal; Notable for the following:    pH, Ven 7.469 (*)    pCO2, Ven 25.3 (*)    Bicarbonate 18.1 (*)    Acid-base deficit 3.6 (*)    All other components within normal limits  I-STAT CHEM 8, ED - Abnormal; Notable for the following:    Calcium, Ion 1.09 (*)    All other components within normal limits  ACETAMINOPHEN LEVEL  ETHANOL  SALICYLATE LEVEL  I-STAT BETA HCG BLOOD, ED ([facility], [facility], [facility] ONLY)  CBG MONITORING, ED    EKG  EKG Interpretation  Date/Time:  Wednesday May 29 2016 11:03:15 EDT Ventricular Rate:  69 PR Interval:    QRS Duration: 83 QT Interval:  422 QTC Calculation: 453 R Axis:   80 Text Interpretation:  Sinus rhythm No acute changes Nonspecific ST abnormality No old tracing to compare normal intervals Confirmed by Rhunette Croft, MD, Janey Genta [phone]) on 05/29/2016 11:28:11 AM       Radiology No results found.  Procedures Procedures (including critical care time)  Medications Ordered in ED Medications  0.9 %  sodium chloride infusion (125 mL/hr Intravenous New Bag/Given 05/29/16 1308)  LORazepam (ATIVAN) 2 MG/ML injection (not administered)  LORazepam (ATIVAN) injection 1 mg (1 mg Intravenous Given 05/29/16 1130)  ziprasidone (GEODON) 20 MG injection (20 mg  Given 05/29/16 1147)  sterile water (preservative free) injection (2 mLs  Given 05/29/16 1149)     Initial Impression / Assessment and Plan / ED Course  I have reviewed the triage vital signs and the nursing notes.  Pertinent labs & imaging results that were available during my care of the patient were reviewed by me and considered in my medical decision making (see chart for details).  Clinical Course as of May 29 1408  Wed May 29, 2016  1126 Pt started shaking and grunting. She is hyperventilating. Not following commands. We will give her ativan. Pt is not having seizures.   [AN]  1126 Non-violent  restraints and ativan ordered.  [AN]  1228 Spoke with Ms. Pierce Crane. She is the mother. She reports that her daughter likely has anxiety and bipolar disorder, but she has never been diagnosed. Starting yday pt has been having "panicky" breathing. She was complaining of heart racing and chest pounding. Pt has been taking meds for migraines, and was noted to be walking into walls. This morning, pt woke up, panting, crying, complaining of chest discomfort and palpitations. EMS was called. She doesn't know if there was any suicidal ideation...but she is not 100% sure and the medication overdose certainly concerns her. She reported that boyfriend is on his way.  [AN]  1234 Pt now sedated after geodon. Pt was yelling that "she  just wants to die" while being combative. Labs reviewed. We will gather more info. But pt likely to be medically cleared for psych eval  [AN]  1408 Boyfriend confirms that he informed patient that he wanted to end the relationship, and she threatened to kill herself. He reports that pt has been feeling depressed last month or so and has frequently told him that she wants to end her life.  [AN]    Clinical Course User Index [AN] Derwood Kaplan, MD    PT comes in with cc of AMS and overdose.  We will get basic labs for psych and OD ordered. Currently stable. Not a candidate for charcoal as the ingestion time is unclear and certainly appears to be more than 1 hour. EKG is reassuring. Seems like there is some underlying psychiatric care, and she will need psych clearance when medically cleared.  Final Clinical Impressions(s) / ED Diagnoses   Final diagnoses:  Suicidal ideations    New Prescriptions New Prescriptions   No medications on file     Derwood Kaplan, MD 05/30/16 1820

## 2016-05-29 NOTE — ED Triage Notes (Signed)
Per EMS- Patient took an unknown amount of Butalb/Tylenol/Caffeine 50/325 mg last night-unknown time that patient actually took. Patient was combative at the scene and was given Versed 5 mg IV per EMS. Patient's boyfriend supposedly broke up with the patient last night. Patient voiced that she wanted to harm herself to EMS.

## 2016-05-29 NOTE — ED Notes (Signed)
IVC papers just received to this unit.

## 2016-05-29 NOTE — BH Assessment (Addendum)
Tele Assessment Note   Sonya Ruiz is an 31 y.o. female, who presents involuntary and unaccompanied to [facility]. During the assessment the pt was a poor historian and refused to engage in the TTS consult. Pt reported, "I don't want to talk to nobody that's not getting me out of here." Pt reported, I need a real doctor not a nurse or a tech. Pt reported, "I'm not suicidal, I was not trying to overdose." Pt reported, taking four pills is not an overdose." Pt reported, I swear to God, I hope everybody in this hospital die." As the pt was transferred to [facility] the pt began to scream. Once the pt was in her room, she torn the doors off of the cabinets. Pt reported, "it's against my religion for you to stick me." Pt reported, God why did you let these people stick me." Pt denied SI.   Pt was IVC'd by Dr. Rhunette Croft. Per IVC paperwork: "Sonya Ruiz was brought to ER after an overdose. Allegedly, Sonya Ruiz overdosed on several medications with intention to harm. In the ER she is combative and threatening to leave."   Clinician was unable to assess the following: "history of abuse, substance use, concentration, insight, impulse control, memory, history of violence, self-injurious behaviors, linkage to OPT resources, orientation, access to weapons, previous inpatient admissions. Per chart, pt's UDS is positive for barbiturates, benzodiazepines and marijuana.   Pt presented alert/irritable in scrubs with irritable speech. Pt's eye contact was poor. Pt's thought process was circumstantial. Pt's judgement was partial. Pt's insight and impulse control are poor.   Diagnosis: Deferred  Past Medical History:  Past Medical History:  Diagnosis Date  . Migraines     History reviewed. No pertinent surgical history.  Family History: History reviewed. No pertinent family history.  Social History:  reports that she has been smoking Cigarettes.  She has never used smokeless tobacco. She reports that she drinks alcohol.  She reports that she does not use drugs.  Additional Social History:  Alcohol / Drug Use Pain Medications: See MAR Prescriptions: See MAR Over the Counter:  See MAR History of alcohol / drug use?:  (UTA)  CIWA: CIWA-Ar BP: (!) 99/55 Pulse Rate: 61 COWS:    PATIENT STRENGTHS: (choose at least two) Average or above average intelligence General fund of knowledge  Allergies:  Allergies  Allergen Reactions  . Muscle Rub [Camphor-Menthol-Methyl Sal] Hives  . Latex Itching and Rash    Home Medications:  (Not in a hospital admission)  OB/GYN Status:  Patient's last menstrual period was 05/05/2016 (approximate).  General Assessment Data Location of Assessment: [facility] ED TTS Assessment: In system Is this a Tele or Face-to-Face Assessment?: Face-to-Face Is this an Initial Assessment or a Re-assessment for this encounter?: Initial Assessment Marital status: Single Is patient pregnant?: No Pregnancy Status: No Living Arrangements: Other (Comment) (UTA) Can pt return to current living arrangement?:  (UTA) Admission Status: Involuntary Referral Source: Self/Family/Friend Insurance type: [employer]     Crisis Care Plan Living Arrangements: Other (Comment) (UTA) Legal Guardian: Other: (UTA) Name of Psychiatrist: UTA Name of Therapist: UTA  Education Status Is patient currently in school?:  (UTA) Current Grade: UTA Highest grade of school patient has completed: UTA Name of school: UTA Contact person: UTA  Risk to self with the past 6 months Suicidal Ideation: No (Pt denies. ) Has patient been a risk to self within the past 6 months prior to admission? : Yes Suicidal Intent: Yes-Currently Present Has patient had any suicidal intent  within the past 6 months prior to admission? : Yes Is patient at risk for suicide?: Yes Suicidal Plan?: Yes-Currently Present Has patient had any suicidal plan within the past 6 months prior to admission? : Yes Specify Current Suicidal Plan: Pt  overdosed on medication.  Access to Means: Yes Specify Access to Suicidal Means: Pt has access to medication. What has been your use of drugs/alcohol within the last 12 months?: UDS postive for barbituates, benzodiazepines and marijuana.  Previous Attempts/Gestures:  (UTA) How many times?:  (UTA) Other Self Harm Risks: UTA Triggers for Past Attempts: Unpredictable Intentional Self Injurious Behavior:  (UTA) Recent stressful life event(s): Other (Comment) (UTA) Persecutory voices/beliefs?:  (UTA) Depression:  (UTA) Depression Symptoms:  (UTA) Substance abuse history and/or treatment for substance abuse?: Yes Suicide prevention information given to non-admitted patients: Not applicable  Risk to Others within the past 6 months Homicidal Ideation: No (UTA) Does patient have any lifetime risk of violence toward others beyond the six months prior to admission? : Unknown Thoughts of Harm to Others:  (UTA) Current Homicidal Intent:  (UTA) Current Homicidal Plan:  (UTA) Access to Homicidal Means:  (UTA) Identified Victim: UTA History of harm to others?:  (UTA) Assessment of Violence: On admission Violent Behavior Description: Pt combative with hospital staff.  Does patient have access to weapons?:  (UTA) Criminal Charges Pending?:  (UTA) Does patient have a court date:  (UTA) Is patient on probation?:  (UTA)  Psychosis Hallucinations: None noted (Pt denies. ) Delusions: None noted  Mental Status Report Appearance/Hygiene: In scrubs Eye Contact: Poor Motor Activity: Unremarkable Speech: Logical/coherent Level of Consciousness: Irritable Mood: Irritable, Angry Affect: Irritable Anxiety Level: None Thought Processes: Circumstantial Judgement: Partial Orientation: Unable to assess Obsessive Compulsive Thoughts/Behaviors: Unable to Assess  Cognitive Functioning Concentration: Unable to Assess Memory: Unable to Assess IQ: Average Insight: Poor Impulse Control: Poor Appetite:   (UTA) Sleep: Unable to Assess Vegetative Symptoms: Unable to Assess  ADLScreening [facility] Assessment Services) Patient's cognitive ability adequate to safely complete daily activities?: Yes Patient able to express need for assistance with ADLs?: Yes Independently performs ADLs?: Yes (appropriate for developmental age)  Prior Inpatient Therapy Prior Inpatient Therapy:  (UTA) Prior Therapy Dates: UTA Prior Therapy Facilty/Provider(s): UTA Reason for Treatment: UTA  Prior Outpatient Therapy Prior Outpatient Therapy:  (UTA) Prior Therapy Dates: UTA Prior Therapy Facilty/Provider(s): UTA Reason for Treatment: UTA Does patient have an ACCT team?:  (UTA) Does patient have Intensive In-House Services?  :  (UTA) Does patient have [facility] services? :  (UTA) Does patient have P4CC services?:  (UTA)  ADL Screening (condition at time of admission) Patient's cognitive ability adequate to safely complete daily activities?: Yes Is the patient deaf or have difficulty hearing?: No Does the patient have difficulty seeing, even when wearing glasses/contacts?:  (UTA) Does the patient have difficulty concentrating, remembering, or making decisions?: Yes Patient able to express need for assistance with ADLs?: Yes Does the patient have difficulty dressing or bathing?: No Independently performs ADLs?: Yes (appropriate for developmental age) Does the patient have difficulty walking or climbing stairs?: No Weakness of Legs: None Weakness of Arms/Hands: None       Abuse/Neglect Assessment (Assessment to be complete while patient is alone) Physical Abuse:  (UTA) Verbal Abuse:  (UTA) Sexual Abuse:  (UTA) Exploitation of patient/patient's resources:  (UTA) Self-Neglect:  (UTA)     Advance Directives (For Healthcare) Does Patient Have a Medical Advance Directive?: No    Additional Information 1:1 In Past 12 Months?: No CIRT  Risk: Yes Elopement Risk: Yes Does patient have medical clearance?:  Yes     Disposition: Donell Sievert, PA recommends inpatient treatment, no appropriate beds available. Disposition discussed with Dr. Corlis Leak and  Leslie Andrea, RN.  Disposition Initial Assessment Completed for this Encounter: Yes Disposition of Patient: Inpatient treatment program Type of inpatient treatment program: Adult  Gwinda Passe 05/29/2016 11:15 PM   Gwinda Passe, MS, [facility], [facility] Triage Specialist [phone]

## 2016-05-29 NOTE — ED Notes (Addendum)
Pt now yelling & demanding to leave stating "my boyfriend broke up with me but it wasn't his fault.  I want you to take it out of my chart.  He's bringing me Dione Plover right now."  Pt informed no outside food allowed.  Pt provided other food options within dept.  Pt then stated "I don't eat that mess.  Only people without teeth eat soup & crackers.  It's against my religion.  I'm Jehovah Witness."  Pt removed leads.  Security called to [facility].  Pt now refusing to answer further questions stating "I have amnesia".  Pt threatening to sue the hospital stating "I get 3 phone calls a day.  I want to call my lawyer.  I'm getting ready to mess this place up.  I'm about to get pissed and go s--- and throw up all in this BR"

## 2016-05-29 NOTE — BHH Counselor (Addendum)
Clincian attempted to engage pt in TTS consult, to no avail. Clinician noted from NT/sitter she attempted to wake pt about 15 minutes ago, to no avail. Clinician noted from NT/sitter the nurse is going to try to wake the pt in about an hour. TTS to will continue to follow up.   Gwinda Passe, MS, [facility] Triage Specialist [phone]

## 2016-05-29 NOTE — ED Notes (Signed)
Bed: [facility]A Expected date:  Expected time:  Means of arrival:  Comments: EMS SI/Overdose 

## 2016-05-29 NOTE — ED Notes (Signed)
[facility]po[facility] control notified of patient's labs and possible consumption of Fiorecet. Almira Coaster from Poison control faxing recommendations to physician

## 2016-05-29 NOTE — ED Notes (Signed)
Received call from [facility] Control recommending to repeat EKG, Tylenol level and LFT's, begin tx with NAC if elevated enzymes.  Dr. Corlis Leak unable to take call @ this time.  # to return call to [employer] obtained, [phone] Almira Coaster).  Information provided to Dr. Corlis Leak.

## 2016-05-29 NOTE — ED Notes (Signed)
TTS attempting to do assessment.

## 2016-05-29 NOTE — ED Notes (Signed)
Bed: [facility] Expected date:  Expected time:  Means of arrival:  Comments: seclusion

## 2016-05-29 NOTE — BH Assessment (Signed)
[facility] Assessment Progress Note  1830 Patient relocated to WA18 from RESA continues to be monitored. Patient too sedated to ambulate to SAPPU or to be assessed.

## 2016-05-29 NOTE — ED Notes (Signed)
Pt belongings: Wallace Cullens shoes, black short sleeve shirt, gray sweat pants

## 2016-05-29 NOTE — ED Provider Notes (Signed)
I was called to patient's room because she was becoming increasingly upset. Patient yelling out about how she is a Psychologist, counselling witness. Then she started yelling she wanted to kill herself. Patient escalated and started trying to tear up the room. Patient given 20 of Geodon as well as Benadryl and Ativan per the psych PA. I saw the patient and she was continued to escalate. Patient needed to be removed the seclusion room. IVC paperwork was filled out.   Courteney Randall An, MD 05/29/16 [phone]

## 2016-05-29 NOTE — ED Notes (Addendum)
Pt became combative when Security attempted to escort pt to SAPPU.  Pt yelling, screaming & making threats.

## 2016-05-29 NOTE — ED Notes (Signed)
Patient combative, trying to get OOB and hyperventilating. Patient repeatedly states she wants to die and wants to kill herself.

## 2016-05-29 NOTE — ED Notes (Signed)
Pt now stating "I'm going to post on fb, don't go to [facility], they kill people for fake a-- procedures.  I want to see a real doctor."

## 2016-05-30 ENCOUNTER — Emergency Department (HOSPITAL_COMMUNITY): Payer: Managed Care, Other (non HMO)

## 2016-05-30 DIAGNOSIS — F4325 Adjustment disorder with mixed disturbance of emotions and conduct: Secondary | ICD-10-CM

## 2016-05-30 DIAGNOSIS — F1099 Alcohol use, unspecified with unspecified alcohol-induced disorder: Secondary | ICD-10-CM | POA: Diagnosis not present

## 2016-05-30 DIAGNOSIS — F17210 Nicotine dependence, cigarettes, uncomplicated: Secondary | ICD-10-CM

## 2016-05-30 NOTE — Consult Note (Signed)
[facility] Face-to-Face Psychiatry Consult   Reason for Consult:  Took a few too many Fioricet Referring Physician:  EDP Patient Identification: Sonya Ruiz MRN:  [id number] Principal Diagnosis: Adjustment disorder with mixed disturbance of emotions and conduct Diagnosis:   Patient Active Problem List   Diagnosis Date Noted  . Adjustment disorder with mixed disturbance of emotions and conduct [F43.25] 05/30/2016    Priority: High    Total Time spent with patient: 45 minutes  Subjective:   Sonya Ruiz is a 31 y.o. female patient does not warrant admission.  HPI:  31 yo female who had a migraine and took six Fioricet and had an altercation with her boyfriend who told the police she was suicidal.  She denies this.  Today, she is calm and cooperative today, clear and coherent.  No suicidal/homicidal ideations, hallucinations, or substance abuse.  No psychiatric history.  Her mother came and has no safety concerns.  Past Psychiatric History: none  Risk to Self: None Risk to Others: None Prior Inpatient Therapy: Prior Inpatient Therapy:  (UTA) Prior Therapy Dates: UTA Prior Therapy Facilty/Provider(s): UTA Reason for Treatment: UTA Prior Outpatient Therapy: Prior Outpatient Therapy:  (UTA) Prior Therapy Dates: UTA Prior Therapy Facilty/Provider(s): UTA Reason for Treatment: UTA Does patient have an ACCT team?:  (UTA) Does patient have Intensive In-House Services?  :  (UTA) Does patient have [facility] services? :  (UTA) Does patient have P4CC services?:  (UTA)  Past Medical History:  Past Medical History:  Diagnosis Date  . Migraines    History reviewed. No pertinent surgical history. Family History: History reviewed. No pertinent family history. Family Psychiatric  History: none Social History:  History  Alcohol Use  . Yes    Comment: Occasionally      History  Drug Use No    Social History   Social History  . Marital status: Single    Spouse name: N/A  . Number  of children: N/A  . Years of education: N/A   Social History Main Topics  . Smoking status: Current Every Day Smoker    Types: Cigarettes  . Smokeless tobacco: Never Used  . Alcohol use Yes     Comment: Occasionally   . Drug use: No  . Sexual activity: Not Asked   Other Topics Concern  . None   Social History Narrative  . None   Additional Social History:    Allergies:   Allergies  Allergen Reactions  . Muscle Rub [Camphor-Menthol-Methyl Sal] Hives  . Latex Itching and Rash    Labs:  Results for orders placed or performed during the hospital encounter of 05/29/16 (from the past 48 hour(s))  POC CBG, ED     Status: None   Collection Time: 05/29/16 11:06 AM  Result Value Ref Range   Glucose-Capillary 87 65 - 99 mg/dL  Acetaminophen level     Status: None   Collection Time: 05/29/16 11:15 AM  Result Value Ref Range   Acetaminophen (Tylenol), Serum 13 10 - 30 ug/mL    Comment:        THERAPEUTIC CONCENTRATIONS VARY SIGNIFICANTLY. A RANGE OF 10-30 ug/mL MAY BE AN EFFECTIVE CONCENTRATION FOR MANY PATIENTS. HOWEVER, SOME ARE BEST TREATED AT CONCENTRATIONS OUTSIDE THIS RANGE. ACETAMINOPHEN CONCENTRATIONS >150 ug/mL AT 4 HOURS AFTER INGESTION AND >50 ug/mL AT 12 HOURS AFTER INGESTION ARE OFTEN ASSOCIATED WITH TOXIC REACTIONS.   Comprehensive metabolic panel     Status: Abnormal   Collection Time: 05/29/16 11:15 AM  Result Value Ref Range  Sodium 138 135 - 145 mmol/L   Potassium 3.8 3.5 - 5.1 mmol/L   Chloride 108 101 - 111 mmol/L   CO2 19 (L) 22 - 32 mmol/L   Glucose, Bld 89 65 - 99 mg/dL   BUN 9 6 - 20 mg/dL   Creatinine, Ser 1.13 (H) 0.44 - 1.00 mg/dL   Calcium 9.2 8.9 - 10.3 mg/dL   Total Protein 7.6 6.5 - 8.1 g/dL   Albumin 4.4 3.5 - 5.0 g/dL   AST 35 15 - 41 U/L   ALT 19 14 - 54 U/L   Alkaline Phosphatase 61 38 - 126 U/L   Total Bilirubin 0.9 0.3 - 1.2 mg/dL   GFR calc non Af Amer >60 >60 mL/min   GFR calc Af Amer >60 >60 mL/min    Comment:  (NOTE) The eGFR has been calculated using the CKD EPI equation. This calculation has not been validated in all clinical situations. eGFR's persistently <60 mL/min signify possible Chronic Kidney Disease.    Anion gap 11 5 - 15  Urine rapid drug screen (hosp performed)not at [facility]     Status: Abnormal   Collection Time: 05/29/16 11:15 AM  Result Value Ref Range   Opiates NONE DETECTED NONE DETECTED   Cocaine NONE DETECTED NONE DETECTED   Benzodiazepines POSITIVE (A) NONE DETECTED   Amphetamines NONE DETECTED NONE DETECTED   Tetrahydrocannabinol POSITIVE (A) NONE DETECTED   Barbiturates POSITIVE (A) NONE DETECTED    Comment:        DRUG SCREEN FOR MEDICAL PURPOSES ONLY.  IF CONFIRMATION IS NEEDED FOR ANY PURPOSE, NOTIFY LAB WITHIN 5 DAYS.        LOWEST DETECTABLE LIMITS FOR URINE DRUG SCREEN Drug Class       Cutoff (ng/mL) Amphetamine      1000 Barbiturate      200 Benzodiazepine   616 Tricyclics       073 Opiates          300 Cocaine          300 THC              50   CBC WITH DIFFERENTIAL     Status: Abnormal   Collection Time: 05/29/16 11:15 AM  Result Value Ref Range   WBC 5.9 4.0 - 10.5 K/uL   RBC 4.61 3.87 - 5.11 MIL/uL   Hemoglobin 13.1 12.0 - 15.0 g/dL   HCT 39.5 36.0 - 46.0 %   MCV 85.7 78.0 - 100.0 fL   MCH 28.4 26.0 - 34.0 pg   MCHC 33.2 30.0 - 36.0 g/dL   RDW 17.3 (H) 11.5 - 15.5 %   Platelets 188 150 - 400 K/uL   Neutrophils Relative % 63 %   Neutro Abs 3.7 1.7 - 7.7 K/uL   Lymphocytes Relative 31 %   Lymphs Abs 1.8 0.7 - 4.0 K/uL   Monocytes Relative 6 %   Monocytes Absolute 0.4 0.1 - 1.0 K/uL   Eosinophils Relative 0 %   Eosinophils Absolute 0.0 0.0 - 0.7 K/uL   Basophils Relative 0 %   Basophils Absolute 0.0 0.0 - 0.1 K/uL  Ethanol     Status: None   Collection Time: 05/29/16 11:15 AM  Result Value Ref Range   Alcohol, Ethyl (B) <5 <5 mg/dL    Comment:        LOWEST DETECTABLE LIMIT FOR SERUM ALCOHOL IS 5 mg/dL FOR MEDICAL PURPOSES ONLY    Salicylate level     Status:  None   Collection Time: 05/29/16 11:15 AM  Result Value Ref Range   Salicylate Lvl <1.1 2.8 - 30.0 mg/dL  Urinalysis, Routine w reflex microscopic     Status: Abnormal   Collection Time: 05/29/16 11:15 AM  Result Value Ref Range   Color, Urine STRAW (A) YELLOW   APPearance CLEAR CLEAR   Specific Gravity, Urine 1.008 1.005 - 1.030   pH 6.0 5.0 - 8.0   Glucose, UA NEGATIVE NEGATIVE mg/dL   Hgb urine dipstick NEGATIVE NEGATIVE   Bilirubin Urine NEGATIVE NEGATIVE   Ketones, ur 20 (A) NEGATIVE mg/dL   Protein, ur NEGATIVE NEGATIVE mg/dL   Nitrite NEGATIVE NEGATIVE   Leukocytes, UA NEGATIVE NEGATIVE  Blood gas, venous     Status: Abnormal   Collection Time: 05/29/16 11:20 AM  Result Value Ref Range   pH, Ven 7.469 (H) 7.250 - 7.430   pCO2, Ven 25.3 (L) 44.0 - 60.0 mmHg   pO2, Ven 35.4 32.0 - 45.0 mmHg   Bicarbonate 18.1 (L) 20.0 - 28.0 mmol/L   Acid-base deficit 3.6 (H) 0.0 - 2.0 mmol/L   O2 Saturation 72.4 %   Patient temperature 98.6    Collection site VENOUS    Drawn by COLLECTED BY LABORATORY    Sample type VENOUS   I-Stat Beta hCG blood, ED ([facility], [facility], [facility] only)     Status: None   Collection Time: 05/29/16 11:28 AM  Result Value Ref Range   I-stat hCG, quantitative <5.0 <5 mIU/mL   Comment 3            Comment:   GEST. AGE      CONC.  (mIU/mL)   <=1 WEEK        5 - 50     2 WEEKS       50 - 500     3 WEEKS       100 - 10,000     4 WEEKS     1,000 - 30,000        FEMALE AND NON-PREGNANT FEMALE:     LESS THAN 5 mIU/mL   I-Stat Chem 8, ED  (not at [facility], [facility])     Status: Abnormal   Collection Time: 05/29/16 11:29 AM  Result Value Ref Range   Sodium 140 135 - 145 mmol/L   Potassium 4.4 3.5 - 5.1 mmol/L   Chloride 106 101 - 111 mmol/L   BUN 9 6 - 20 mg/dL   Creatinine, Ser 1.00 0.44 - 1.00 mg/dL   Glucose, Bld 85 65 - 99 mg/dL   Calcium, Ion 1.09 (L) 1.15 - 1.40 mmol/L   TCO2 21 0 - 100 mmol/L   Hemoglobin 14.3 12.0 - 15.0 g/dL   HCT 42.0  36.0 - 46.0 %  Hepatic function panel     Status: Abnormal   Collection Time: 05/29/16  9:03 PM  Result Value Ref Range   Total Protein 6.4 (L) 6.5 - 8.1 g/dL   Albumin 3.7 3.5 - 5.0 g/dL   AST 22 15 - 41 U/L   ALT 17 14 - 54 U/L   Alkaline Phosphatase 53 38 - 126 U/L   Total Bilirubin 0.5 0.3 - 1.2 mg/dL   Bilirubin, Direct <0.1 (L) 0.1 - 0.5 mg/dL   Indirect Bilirubin NOT CALCULATED 0.3 - 0.9 mg/dL  Acetaminophen level     Status: Abnormal   Collection Time: 05/29/16  9:03 PM  Result Value Ref Range   Acetaminophen (Tylenol), Serum <10 (L) 10 -  30 ug/mL    Comment:        THERAPEUTIC CONCENTRATIONS VARY SIGNIFICANTLY. A RANGE OF 10-30 ug/mL MAY BE AN EFFECTIVE CONCENTRATION FOR MANY PATIENTS. HOWEVER, SOME ARE BEST TREATED AT CONCENTRATIONS OUTSIDE THIS RANGE. ACETAMINOPHEN CONCENTRATIONS >150 ug/mL AT 4 HOURS AFTER INGESTION AND >50 ug/mL AT 12 HOURS AFTER INGESTION ARE OFTEN ASSOCIATED WITH TOXIC REACTIONS.     Current Facility-Administered Medications  Medication Dose Route Frequency Provider Last Rate Last Dose  . 0.9 %  sodium chloride infusion   Intravenous Continuous Varney Biles, MD   Stopped at 05/29/16 2033  . ondansetron (ZOFRAN) tablet 4 mg  4 mg Oral Q8H PRN Varney Biles, MD       Current Outpatient Prescriptions  Medication Sig Dispense Refill  . butalbital-acetaminophen-caffeine (FIORICET, ESGIC) 50-325-40 MG tablet Take 1-2 tablets by mouth every 6 (six) hours as needed for headache. 20 tablet 0  . acyclovir (ZOVIRAX) 400 MG tablet Take 1 tablet (400 mg total) by mouth 3 (three) times daily. (Patient not taking: Reported on 12/04/2015) 21 tablet 0  . Aspirin-Acetaminophen-Caffeine (EXCEDRIN EXTRA STRENGTH PO) Take 1 tablet by mouth every 6 (six) hours as needed (pain/ headache).    . lidocaine (LIDODERM) 5 % Place 1 patch onto the skin daily. Remove & Discard patch within 12 hours or as directed by MD 30 patch 0  . methocarbamol (ROBAXIN) 500 MG  tablet Take 1 tablet (500 mg total) by mouth 2 (two) times daily. 20 tablet 0  . Multiple Vitamin (MULTIVITAMIN WITH MINERALS) TABS tablet Take 1 tablet by mouth every evening.    . Tetrahydrozoline HCl (VISINE OP) Place 1 drop into both eyes 2 (two) times daily as needed (irritation/ dust).      Musculoskeletal: Strength & Muscle Tone: within normal limits Gait & Station: normal Patient leans: N/A  Psychiatric Specialty Exam: Physical Exam  Constitutional: She is oriented to person, place, and time. She appears well-developed and well-nourished.  HENT:  Head: Normocephalic.  Neck: Normal range of motion.  Respiratory: Effort normal.  Musculoskeletal: Normal range of motion.  Neurological: She is alert and oriented to person, place, and time.  Psychiatric: She has a normal mood and affect. Her speech is normal and behavior is normal. Judgment and thought content normal. Cognition and memory are normal.    Review of Systems  All other systems reviewed and are negative.   Blood pressure 108/68, pulse 68, temperature 97.6 F (36.4 C), temperature source Oral, resp. rate 14, height _0  (1.651 m), weight 62.1 kg (137 lb), last menstrual period 05/05/2016, SpO2 99 %.Body mass index is 22.8 kg/m.  General Appearance: Casual  Eye Contact:  Good  Speech:  Clear and Coherent  Volume:  Normal  Mood:  Euthymic  Affect:  Congruent  Thought Process:  Coherent and Descriptions of Associations: Intact  Orientation:  Full (Time, Place, and Person)  Thought Content:  WDL and Logical  Suicidal Thoughts:  No  Homicidal Thoughts:  No  Memory:  Immediate;   Good Recent;   Good Remote;   Good  Judgement:  Fair  Insight:  Good  Psychomotor Activity:  Normal  Concentration:  Concentration: Good and Attention Span: Good  Recall:  Good  Fund of Knowledge:  Fair  Language:  Good  Akathisia:  No  Handed:  Right  AIMS (if indicated):     Assets:  Housing Leisure Time Physical  Health Resilience  ADL's:  Intact  Cognition:  WNL  Sleep:  Treatment Plan Summary: Daily contact with patient to assess and evaluate symptoms and progress in treatment, Medication management and Plan adustment disorder with mixed disturbance of conduct and emotions:  -Crisis stabilization -Medication management:  None needed -Individual counseling  Disposition: No evidence of imminent risk to self or others at present.    Waylan Boga, NP 05/30/2016 11:17 AM  Patient seen face-to-face for psychiatric evaluation, chart reviewed and case discussed with the physician extender and developed treatment plan. Reviewed the information documented and agree with the treatment plan. Corena Pilgrim, MD

## 2016-05-30 NOTE — [facility] Suicide Risk Assessment (Signed)
Suicide Risk Assessment  Discharge Assessment   [facility] Discharge Suicide Risk Assessment   Principal Problem: Adjustment disorder with mixed disturbance of emotions and conduct Discharge Diagnoses:  Patient Active Problem List   Diagnosis Date Noted  . Adjustment disorder with mixed disturbance of emotions and conduct [F43.25] 05/30/2016    Priority: High    Total Time spent with patient: 45 minutes  Musculoskeletal: Strength & Muscle Tone: within normal limits Gait & Station: normal Patient leans: N/A  Psychiatric Specialty Exam: Physical Exam  Constitutional: She is oriented to person, place, and time. She appears well-developed and well-nourished.  HENT:  Head: Normocephalic.  Neck: Normal range of motion.  Respiratory: Effort normal.  Musculoskeletal: Normal range of motion.  Neurological: She is alert and oriented to person, place, and time.  Psychiatric: She has a normal mood and affect. Her speech is normal and behavior is normal. Judgment and thought content normal. Cognition and memory are normal.    Review of Systems  All other systems reviewed and are negative.   Blood pressure 108/68, pulse 68, temperature 97.6 F (36.4 C), temperature source Oral, resp. rate 14, height  (1.651 m), weight 62.1 kg (137 lb), last menstrual period 05/05/2016, SpO2 99 %.Body mass index is 22.8 kg/m.  General Appearance: Casual  Eye Contact:  Good  Speech:  Clear and Coherent  Volume:  Normal  Mood:  Euthymic  Affect:  Congruent  Thought Process:  Coherent and Descriptions of Associations: Intact  Orientation:  Full (Time, Place, and Person)  Thought Content:  WDL and Logical  Suicidal Thoughts:  No  Homicidal Thoughts:  No  Memory:  Immediate;   Good Recent;   Good Remote;   Good  Judgement:  Fair  Insight:  Good  Psychomotor Activity:  Normal  Concentration:  Concentration: Good and Attention Span: Good  Recall:  Good  Fund of Knowledge:  Fair  Language:  Good   Akathisia:  No  Handed:  Right  AIMS (if indicated):     Assets:  Housing Leisure Time Physical Health Resilience  ADL's:  Intact  Cognition:  WNL  Sleep:       Mental Status Per Nursing Assessment::   On Admission:     Demographic Factors:  Adolescent or young adult  Loss Factors: NA  Historical Factors: NA  Risk Reduction Factors:   Sense of responsibility to family, Living with another person, especially a relative and Positive social support  Continued Clinical Symptoms:  None  Cognitive Features That Contribute To Risk:  None    Suicide Risk:  Minimal: No identifiable suicidal ideation.  Patients presenting with no risk factors but with morbid ruminations; may be classified as minimal risk based on the severity of the depressive symptoms  Follow-up Information    Karen Chafe, MD.   Specialty:  Orthopedic Surgery Why:  Follow-up in 3-5 days for follow-up of ulnar/forearm fracture Contact information: [address] [place] [address] [phone]           Plan Of Care/Follow-up recommendations:  Activity:  as tolerated Diet:  heart healthy diet  Asier Desroches, NP 05/30/2016, 3:29 PM

## 2016-05-30 NOTE — ED Notes (Signed)
Patient discharged to home.  Denies thoughts of harm to self or others.  Denies auditory or visual hallucinations.  All belongings returned and signed for.  Patient was escorted to the front lobby.
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
## 2016-06-25 ENCOUNTER — Encounter: Payer: Self-pay | Admitting: Obstetrics and Gynecology

## 2016-11-05 ENCOUNTER — Emergency Department (HOSPITAL_COMMUNITY)
Admission: EM | Admit: 2016-11-05 | Discharge: 2016-11-05 | Disposition: A | Discharge status: Home / Self Care | Payer: Managed Care, Other (non HMO) | Attending: Emergency Medicine | Admitting: Emergency Medicine

## 2016-11-05 ENCOUNTER — Encounter (HOSPITAL_COMMUNITY): Payer: Self-pay | Admitting: Emergency Medicine

## 2016-11-05 DIAGNOSIS — G43009 Migraine without aura, not intractable, without status migrainosus: Secondary | ICD-10-CM | POA: Insufficient documentation

## 2016-11-05 DIAGNOSIS — R112 Nausea with vomiting, unspecified: Secondary | ICD-10-CM | POA: Insufficient documentation

## 2016-11-05 DIAGNOSIS — F17210 Nicotine dependence, cigarettes, uncomplicated: Secondary | ICD-10-CM | POA: Insufficient documentation

## 2016-11-05 MED ORDER — DEXAMETHASONE SODIUM PHOSPHATE 10 MG/ML IJ SOLN
10.0000 mg | Freq: Once | INTRAMUSCULAR | Status: AC
  Administered 2016-11-05: 10 mg via INTRAVENOUS
  Filled 2016-11-05: qty 1

## 2016-11-05 MED ORDER — DIPHENHYDRAMINE HCL 50 MG/ML IJ SOLN
12.5000 mg | Freq: Once | INTRAMUSCULAR | Status: AC
  Administered 2016-11-05: 12.5 mg via INTRAVENOUS
  Filled 2016-11-05: qty 1

## 2016-11-05 MED ORDER — METOCLOPRAMIDE HCL 5 MG/ML IJ SOLN
10.0000 mg | Freq: Once | INTRAMUSCULAR | Status: AC
  Administered 2016-11-05: 10 mg via INTRAVENOUS
  Filled 2016-11-05: qty 2

## 2016-11-05 MED ORDER — KETOROLAC TROMETHAMINE 30 MG/ML IJ SOLN
15.0000 mg | Freq: Once | INTRAMUSCULAR | Status: AC
  Administered 2016-11-05: 15 mg via INTRAVENOUS
  Filled 2016-11-05: qty 1

## 2016-11-05 NOTE — ED Provider Notes (Signed)
[facility]-EMERGENCY DEPT Provider Note   CSN: [id number] Arrival date & time: 11/05/16  1722     History   Chief Complaint Chief Complaint  Patient presents with  . Migraine    HPI Sonya Ruiz is a 31 y.o. female.  The history is provided by the patient.  Migraine  This is a recurrent problem. The current episode started 3 to 5 hours ago. The problem occurs constantly. The problem has been gradually improving. Associated symptoms include headaches. Pertinent negatives include no chest pain, no abdominal pain and no shortness of breath. Associated symptoms comments: Nausea without vomiting.  No fever.  Intermittent blurry vision.  No unilateral weakness or numbness.  No sinus sx.. Exacerbated by: loud noise but not affected by light. Nothing relieves the symptoms. She has tried nothing for the symptoms. The treatment provided no relief.    Past Medical History:  Diagnosis Date  . Migraines     Patient Active Problem List   Diagnosis Date Noted  . 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 No pertinent surgical history.  OB History    No data available       Home Medications    Prior to Admission medications   Medication Sig Start Date End Date Taking? Authorizing Provider  Aspirin-Acetaminophen-Caffeine (EXCEDRIN EXTRA STRENGTH PO) Take 1 tablet by mouth every 6 (six) hours as needed (pain/ headache).   Yes [provider]  aspirin-acetaminophen-caffeine (EXCEDRIN MIGRAINE) [phone] MG tablet Take 1 tablet by mouth every 6 (six) hours as needed for headache.   Yes [provider]  acyclovir (ZOVIRAX) 400 MG tablet Take 1 tablet (400 mg total) by mouth 3 (three) times daily. Patient not taking: Reported on 11/05/2016 04/15/15   Barrett, Rolm Gala, PA-C  butalbital-acetaminophen-caffeine (FIORICET, ESGIC) 50-325-40 MG tablet Take 1-2 tablets by mouth every 6 (six) hours as needed for  headache. Patient not taking: Reported on 11/05/2016 05/14/16 05/14/17  Willy Eddy, MD  lidocaine (LIDODERM) 5 % Place 1 patch onto the skin daily. Remove & Discard patch within 12 hours or as directed by MD Patient not taking: Reported on 11/05/2016 12/05/15   Joy, Hillard Danker, PA-C  methocarbamol (ROBAXIN) 500 MG tablet Take 1 tablet (500 mg total) by mouth 2 (two) times daily. Patient not taking: Reported on 11/05/2016 12/05/15   Anselm Pancoast, PA-C    Family History No family history on file.  Social History Social History  Substance Use Topics  . Smoking status: Current Every Day Smoker    Types: Cigarettes  . Smokeless tobacco: Never Used  . Alcohol use Yes     Comment: Occasionally      Allergies   Muscle rub [camphor-menthol-methyl sal] and Latex   Review of Systems Review of Systems  Respiratory: Negative for shortness of breath.   Cardiovascular: Negative for chest pain.  Gastrointestinal: Negative for abdominal pain.  Neurological: Positive for headaches.  All other systems reviewed and are negative.    Physical Exam Updated Vital Signs BP 108/74 (BP Location: Left Arm)   Pulse 66   Temp 98.8 F (37.1 C) (Oral)   Resp 16   Ht  (1.651 m)   Wt 60.6 kg (133 lb 8 oz)   SpO2 100%   BMI 22.22 kg/m   Physical Exam  Constitutional: She is oriented to person, place, and time. She appears well-developed and well-nourished. No distress.  HENT:  Head: Normocephalic and atraumatic.  Eyes: Pupils are  equal, round, and reactive to light. EOM are normal.  Fundoscopic exam:      The right eye shows no papilledema.       The left eye shows no papilledema.  Neck: Normal range of motion. Neck supple.  Cardiovascular: Normal rate, regular rhythm, normal heart sounds and intact distal pulses.  Exam reveals no friction rub.   No murmur heard. Pulmonary/Chest: Effort normal and breath sounds normal. She has no wheezes. She has no rales.  Abdominal: Soft. Bowel sounds  are normal. She exhibits no distension. There is no tenderness. There is no rebound and no guarding.  Musculoskeletal: Normal range of motion. She exhibits no tenderness.  No edema  Lymphadenopathy:    She has no cervical adenopathy.  Neurological: She is alert and oriented to person, place, and time. She has normal strength. No cranial nerve deficit or sensory deficit. Gait normal.  No photophobia  Skin: Skin is warm and dry. No rash noted.  Psychiatric: She has a normal mood and affect. Her behavior is normal.  Nursing note and vitals reviewed.    ED Treatments / Results  Labs (all labs ordered are listed, but only abnormal results are displayed) Labs Reviewed - No data to display  EKG  EKG Interpretation None       Radiology No results found.  Procedures Procedures (including critical care time)  Medications Ordered in ED Medications  metoCLOPramide (REGLAN) injection 10 mg (10 mg Intravenous Given 11/05/16 1944)  diphenhydrAMINE (BENADRYL) injection 12.5 mg (12.5 mg Intravenous Given 11/05/16 1944)  dexamethasone (DECADRON) injection 10 mg (10 mg Intravenous Given 11/05/16 1944)  ketorolac (TORADOL) 30 MG/ML injection 15 mg (15 mg Intravenous Given 11/05/16 1944)     Initial Impression / Assessment and Plan / ED Course  I have reviewed the triage vital signs and the nursing notes.  Pertinent labs & imaging results that were available during my care of the patient were reviewed by me and considered in my medical decision making (see chart for details).     Pt with typical migraine HA without sx suggestive of SAH(sudden onset, worst of life, or deficits), infection, or cavernous vein thrombosis.  Normal neuro exam and vital signs. Will give HA cocktail and re-eval.  9:43 PM Pt feeling much better and d/ced home.   Final Clinical Impressions(s) / ED Diagnoses   Final diagnoses:  Migraine without aura and without status migrainosus, not intractable    New  Prescriptions New Prescriptions   No medications on file     Gwyneth Sprout, MD 11/05/16 2143

## 2016-11-05 NOTE — ED Triage Notes (Signed)
Patient brought in by [facility] from work work for migraine.  Patient saw Neurologist in past and was started on medication but made her sick so patient had to quit taking.  Patient works in really loud environment and couldn't bear it today. Patient had nausea but no vomiting.  Patient recently lost both parents.

## 2016-11-05 NOTE — ED Notes (Signed)
Pt ambulatory and independent at discharge.  Verbalized understanding of discharge instructions 

## 2016-11-05 NOTE — ED Notes (Signed)
Pt stated her head is feeling better.

## 2016-11-18 ENCOUNTER — Emergency Department (HOSPITAL_COMMUNITY)
Admission: EM | Admit: 2016-11-18 | Discharge: 2016-11-18 | Discharge status: Absent without leave | Payer: Managed Care, Other (non HMO) | Source: Home / Self Care

## 2016-11-18 ENCOUNTER — Encounter (HOSPITAL_COMMUNITY): Payer: Self-pay | Admitting: Emergency Medicine

## 2016-11-18 ENCOUNTER — Emergency Department (HOSPITAL_COMMUNITY)
Admission: EM | Admit: 2016-11-18 | Discharge: 2016-11-18 | Disposition: A | Discharge status: Home / Self Care | Payer: 59 | Attending: Emergency Medicine | Admitting: Emergency Medicine

## 2016-11-18 DIAGNOSIS — R51 Headache: Secondary | ICD-10-CM | POA: Insufficient documentation

## 2016-11-18 DIAGNOSIS — F17210 Nicotine dependence, cigarettes, uncomplicated: Secondary | ICD-10-CM | POA: Diagnosis not present

## 2016-11-18 DIAGNOSIS — Z91040 Latex allergy status: Secondary | ICD-10-CM | POA: Insufficient documentation

## 2016-11-18 DIAGNOSIS — R519 Headache, unspecified: Secondary | ICD-10-CM

## 2016-11-18 MED ORDER — SUMATRIPTAN SUCCINATE 6 MG/0.5ML ~~LOC~~ SOLN
6.0000 mg | Freq: Once | SUBCUTANEOUS | Status: AC
  Administered 2016-11-18: 6 mg via SUBCUTANEOUS
  Filled 2016-11-18 (×2): qty 0.5

## 2016-11-18 MED ORDER — DIPHENHYDRAMINE HCL 50 MG/ML IJ SOLN
25.0000 mg | Freq: Once | INTRAMUSCULAR | Status: AC
  Administered 2016-11-18: 25 mg via INTRAVENOUS
  Filled 2016-11-18: qty 1

## 2016-11-18 MED ORDER — SODIUM CHLORIDE 0.9 % IV BOLUS (SEPSIS)
1000.0000 mL | Freq: Once | INTRAVENOUS | Status: AC
Start: 2016-11-18 — End: 2016-11-18
  Administered 2016-11-18: 1000 mL via INTRAVENOUS

## 2016-11-18 MED ORDER — PROCHLORPERAZINE EDISYLATE 5 MG/ML IJ SOLN
10.0000 mg | Freq: Once | INTRAMUSCULAR | Status: AC
  Administered 2016-11-18: 10 mg via INTRAVENOUS
  Filled 2016-11-18: qty 2

## 2016-11-18 MED ORDER — KETOROLAC TROMETHAMINE 30 MG/ML IJ SOLN
30.0000 mg | Freq: Once | INTRAMUSCULAR | Status: AC
Start: 2016-11-18 — End: 2016-11-18
  Administered 2016-11-18: 30 mg via INTRAVENOUS
  Filled 2016-11-18: qty 1

## 2016-11-18 NOTE — ED Triage Notes (Signed)
Pt complaint of ongoing headache for a week; associated nausea.

## 2016-11-18 NOTE — ED Provider Notes (Signed)
[facility]-EMERGENCY DEPT Provider Note   CSN: [id number] Arrival date & time: 11/18/16  1619     History   Chief Complaint Chief Complaint  Patient presents with  . Headache    HPI Sonya Ruiz is a 31 y.o. female.  HPI 31 y.o. female with a hx of Migraines, presents to the Emergency Department today due to headache x 1 week. Associated nausea without emesis. No visual changes. Notes hx similar in past. States headache is bilateral temples and on top of head. Described as throbbing sensation. OTC Excedrin with minimal relief. No CP/SOB/ABD pain. No numbness/tingling. No fevers. No neck stiffness. No other symptoms noted.    Past Medical History:  Diagnosis Date  . Migraines     Patient Active Problem List   Diagnosis Date Noted  . 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 No pertinent surgical history.  OB History    No data available       Home Medications    Prior to Admission medications   Medication Sig Start Date End Date Taking? Authorizing Provider  acyclovir (ZOVIRAX) 400 MG tablet Take 1 tablet (400 mg total) by mouth 3 (three) times daily. Patient not taking: Reported on 11/05/2016 04/15/15   Barrett, Rolm Gala, PA-C  Aspirin-Acetaminophen-Caffeine (EXCEDRIN EXTRA STRENGTH PO) Take 1 tablet by mouth every 6 (six) hours as needed (pain/ headache).    [provider]  aspirin-acetaminophen-caffeine (EXCEDRIN MIGRAINE) [phone] MG tablet Take 1 tablet by mouth every 6 (six) hours as needed for headache.    [provider]  butalbital-acetaminophen-caffeine (FIORICET, ESGIC) 50-325-40 MG tablet Take 1-2 tablets by mouth every 6 (six) hours as needed for headache. Patient not taking: Reported on 11/05/2016 05/14/16 05/14/17  Willy Eddy, MD  lidocaine (LIDODERM) 5 % Place 1 patch onto the skin daily. Remove & Discard patch within 12 hours or as directed by  MD Patient not taking: Reported on 11/05/2016 12/05/15   Joy, Hillard Danker, PA-C  methocarbamol (ROBAXIN) 500 MG tablet Take 1 tablet (500 mg total) by mouth 2 (two) times daily. Patient not taking: Reported on 11/05/2016 12/05/15   Anselm Pancoast, PA-C    Family History No family history on file.  Social History Social History  Substance Use Topics  . Smoking status: Current Every Day Smoker    Types: Cigarettes  . Smokeless tobacco: Never Used  . Alcohol use Yes     Comment: Occasionally      Allergies   Muscle rub [camphor-menthol-methyl sal] and Latex   Review of Systems Review of Systems ROS reviewed and all are negative for acute change except as noted in the HPI.  Physical Exam Updated Vital Signs BP 122/81 (BP Location: Left Arm)   Pulse 81   Temp 98.1 F (36.7 C) (Oral)   Resp 18   Ht  (1.651 m)   Wt 61.2 kg (135 lb)   SpO2 100%   BMI 22.47 kg/m   Physical Exam  Constitutional: Sonya Ruiz is oriented to person, place, and time. Vital signs are normal. Sonya Ruiz appears well-developed and well-nourished.  HENT:  Head: Normocephalic and atraumatic.  Right Ear: Hearing normal.  Left Ear: Hearing normal.  Eyes: Pupils are equal, round, and reactive to light. Conjunctivae and EOM are normal.  Neck: Normal range of motion. Neck supple.  Cardiovascular: Normal rate, regular rhythm, normal heart sounds and intact distal pulses.   Pulmonary/Chest: Effort normal and breath sounds normal.  Musculoskeletal: Normal range of motion.  Neurological: Sonya Ruiz is alert and oriented to person, place, and time. Sonya Ruiz has normal strength. No cranial nerve deficit or sensory deficit.  Cranial Nerves:  II: Pupils equal, round, reactive to light III,IV, VI: ptosis not present, extra-ocular motions intact bilaterally  V,VII: smile symmetric, facial light touch sensation equal VIII: hearing grossly normal bilaterally  IX,X: midline uvula rise  XI: bilateral shoulder shrug equal and strong XII:  midline tongue extension  Skin: Skin is warm and dry.  Psychiatric: Sonya Ruiz has a normal mood and affect. Her speech is normal and behavior is normal. Thought content normal.  Nursing note and vitals reviewed.    ED Treatments / Results  Labs (all labs ordered are listed, but only abnormal results are displayed) Labs Reviewed - No data to display  EKG  EKG Interpretation None       Radiology No results found.  Procedures Procedures (including critical care time)  Medications Ordered in ED Medications  sodium chloride 0.9 % bolus 1,000 mL (not administered)  prochlorperazine (COMPAZINE) injection 10 mg (not administered)  diphenhydrAMINE (BENADRYL) injection 25 mg (not administered)  SUMAtriptan (IMITREX) injection 6 mg (not administered)  ketorolac (TORADOL) 30 MG/ML injection 30 mg (not administered)     Initial Impression / Assessment and Plan / ED Course  I have reviewed the triage vital signs and the nursing notes.  Pertinent labs & imaging results that were available during my care of the patient were reviewed by me and considered in my medical decision making (see chart for details).  Final Clinical Impressions(s) / ED Diagnoses     {I have reviewed the relevant previous healthcare records.  {I obtained HPI from historian.   ED Course:  Assessment: Patient is a 31 y.o. female with a hx of Migraines, presents to the Emergency Department today due to headache x 1 week. Associated nausea without emesis. No visual changes. Notes hx similar in past. States headache is bilateral temples and on top of head. Described as throbbing sensation. OTC Excedrin with minimal relief. No CP/SOB/ABD pain. No numbness/tingling. No fevers. No neck stiffness. Patient is without high-risk features of headache including: Sudden onset/thunderclap HA, No similar headache in past, Altered mental status, Accompanying seizure, Headache with exertion, Age > 50, History of immunocompromise, Neck or  shoulder pain, Fever, Use of anticoagulation, Family history of spontaneous SAH, Concomitant drug use, Toxic exposure.  Patient has a normal complete neurological exam, normal vital signs, normal level of consciousness, no signs of meningismus, is well-appearing/non-toxic appearing, no signs of trauma. No papilledema, no pain over the temporal arteries. Imaging with CT/MRI not indicated given history and physical exam findings. No dangerous or life-threatening conditions suspected or identified by history, physical exam, and by work-up. No indications for hospitalization identified.  Disposition/Plan:  DC Home Additional Verbal discharge instructions given and discussed with patient.  Pt Instructed to f/u with PCP in the next week for evaluation and treatment of symptoms. Return precautions given Pt acknowledges and agrees with plan  Supervising Physician Bethann Berkshire, MD  Final diagnoses:  Nonintractable headache, unspecified chronicity pattern, unspecified headache type    New Prescriptions New Prescriptions   No medications on file     Audry Pili, Cordelia Poche 11/18/16 2312    Bethann Berkshire, MD 11/21/16 [phone]

## 2016-11-18 NOTE — ED Notes (Signed)
No response when called from lobby to triage. 

## 2016-11-18 NOTE — Discharge Instructions (Signed)
Please read and follow all provided instructions.  Your diagnoses today include:  1. Nonintractable headache, unspecified chronicity pattern, unspecified headache type     Tests performed today include: CT of your head which was normal and did not show any serious cause of your headache Vital signs. See below for your results today.   Medications:  In the Emergency Department you received: Reglan - antinausea/headache medication Benadryl - antihistamine to counteract potential side effects of reglan Toradol - NSAID medication similar to ibuprofen  Take any prescribed medications only as directed.  Additional information:  Follow any educational materials contained in this packet.  You are having a headache. No specific cause was found today for your headache. It may have been a migraine or other cause of headache. Stress, anxiety, fatigue, and depression are common triggers for headaches.   Your headache today does not appear to be life-threatening or require hospitalization, but often the exact cause of headaches is not determined in the emergency department. Therefore, follow-up with your doctor is very important to find out what may have caused your headache and whether or not you need any further diagnostic testing or treatment.   Sometimes headaches can appear benign (not harmful), but then more serious symptoms can develop which should prompt an immediate re-evaluation by your doctor or the emergency department.  BE VERY CAREFUL not to take multiple medicines containing Tylenol (also called acetaminophen). Doing so can lead to an overdose which can damage your liver and cause liver failure and possibly death.   Follow-up instructions: Please follow-up with your primary care provider in the next 3 days for further evaluation of your symptoms.   Return instructions:  Please return to the Emergency Department if you experience worsening symptoms. Return if the medications do not  resolve your headache, if it recurs, or if you have multiple episodes of vomiting or cannot keep down fluids. Return if you have a change from the usual headache. RETURN IMMEDIATELY IF you: Develop a sudden, severe headache Develop confusion or become poorly responsive or faint Develop a fever above 100.69F or problem breathing Have a change in speech, vision, swallowing, or understanding Develop new weakness, numbness, tingling, incoordination in your arms or legs Have a seizure Please return if you have any other emergent concerns.  Additional Information:  Your vital signs today were: BP 126/87 (BP Location: Right Arm)    Pulse 66    Temp 98.1 F (36.7 C) (Oral)    Resp 16    Ht  (1.651 m)    Wt 61.2 kg (135 lb)    SpO2 100%    BMI 22.47 kg/m  If your blood pressure (BP) was elevated above 135/85 this visit, please have this repeated by your doctor within one month. --------------

## 2016-11-28 ENCOUNTER — Emergency Department (HOSPITAL_COMMUNITY)
Admission: EM | Admit: 2016-11-28 | Discharge: 2016-11-29 | Disposition: A | Discharge status: Home / Self Care | Payer: 59 | Attending: Emergency Medicine | Admitting: Emergency Medicine

## 2016-11-28 ENCOUNTER — Encounter (HOSPITAL_COMMUNITY): Payer: Self-pay | Admitting: *Deleted

## 2016-11-28 DIAGNOSIS — F17210 Nicotine dependence, cigarettes, uncomplicated: Secondary | ICD-10-CM | POA: Diagnosis not present

## 2016-11-28 DIAGNOSIS — G43901 Migraine, unspecified, not intractable, with status migrainosus: Secondary | ICD-10-CM | POA: Insufficient documentation

## 2016-11-28 DIAGNOSIS — R51 Headache: Secondary | ICD-10-CM | POA: Diagnosis present

## 2016-11-28 LAB — I-STAT BETA HCG BLOOD, ED (MC, WL, AP ONLY): I-stat hCG, quantitative: <5 m[IU]/mL (ref <5)

## 2016-11-28 MED ORDER — MAGNESIUM SULFATE 2 GM/50ML IV SOLN
2.0000 g | Freq: Once | INTRAVENOUS | Status: AC
  Administered 2016-11-28: 2 g via INTRAVENOUS
  Filled 2016-11-28: qty 50

## 2016-11-28 MED ORDER — KETOROLAC TROMETHAMINE 30 MG/ML IJ SOLN
30.0000 mg | Freq: Once | INTRAMUSCULAR | Status: AC
  Administered 2016-11-28: 30 mg via INTRAVENOUS
  Filled 2016-11-28: qty 1

## 2016-11-28 MED ORDER — SODIUM CHLORIDE 0.9 % IV BOLUS (SEPSIS)
1000.0000 mL | Freq: Once | INTRAVENOUS | Status: AC
  Administered 2016-11-28: 1000 mL via INTRAVENOUS

## 2016-11-28 NOTE — ED Provider Notes (Signed)
[facility] EMERGENCY DEPARTMENT Provider Note   CSN: [id number] Arrival date & time: 11/28/16  1642     History   Chief Complaint Chief Complaint  Patient presents with  . Migraine    HPI Sonya Ruiz is a 31 y.o. female.  31 year old female with history of migraines who presents with migraine. Patient reports that she has had recurrent problems with migraines since April of this year. In May she broke her arm and was on Percocet for a while which she thinks kept her headaches that day but then when they switched her off of these medications her headaches worsened. She saw a neurologist once; was given Fioricet which did not work and she did not tolerate it well. She has not had any follow-up visits. She has been to the ED several times and she states that the migraine cocktails do not work and she is requesting an MRI. She denies any fevers. No vision changes. She endorses photophobia and a constant pain around her entire head that feels like a sledgehammer hitting her. She suspects her work triggers the headaches because it involves loud noise and dust. She took 4 Excedrin today with no relief of her symptoms. She does note she has [facility]. [facility] notable for mother with migraines.    The history is provided by the patient.  Migraine     Past Medical History:  Diagnosis Date  . Migraines     Patient Active Problem List   Diagnosis Date Noted  . 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 No pertinent surgical history.  OB History    No data available       Home Medications    Prior to Admission medications   Medication Sig Start Date End Date Taking? Authorizing Provider  aspirin-acetaminophen-caffeine (EXCEDRIN MIGRAINE) [phone] MG tablet Take 4 tablets by mouth every 6 (six) hours as needed for headache.    Yes [provider]    diphenhydramine-acetaminophen (TYLENOL PM) 25-500 MG TABS tablet Take 4 tablets by mouth at bedtime as needed.   Yes [provider]  butalbital-acetaminophen-caffeine (FIORICET, ESGIC) 50-325-40 MG tablet Take 1-2 tablets by mouth every 6 (six) hours as needed for headache. Patient not taking: Reported on 11/05/2016 05/14/16 05/14/17  Willy Eddy, MD  lidocaine (LIDODERM) 5 % Place 1 patch onto the skin daily. Remove & Discard patch within 12 hours or as directed by MD Patient not taking: Reported on 11/05/2016 12/05/15   Joy, Hillard Danker, PA-C  methocarbamol (ROBAXIN) 500 MG tablet Take 1 tablet (500 mg total) by mouth 2 (two) times daily. Patient not taking: Reported on 11/05/2016 12/05/15   Anselm Pancoast, PA-C    Family History No family history on file.  Social History Social History  Substance Use Topics  . Smoking status: Current Every Day Smoker    Types: Cigarettes  . Smokeless tobacco: Never Used  . Alcohol use Yes     Comment: Occasionally      Allergies   Muscle rub [camphor-menthol-methyl sal] and Latex   Review of Systems Review of Systems 10 Systems reviewed and are negative for acute change except as noted in the HPI.   Physical Exam Updated Vital Signs BP 115/69   Pulse 70   Temp 98.5 F (36.9 C) (Oral)   Resp 18   SpO2 100%   Physical Exam  Constitutional: She is oriented to person,  place, and time. She appears well-developed and well-nourished. No distress.  Awake, alert  HENT:  Head: Normocephalic and atraumatic.  Eyes: Pupils are equal, round, and reactive to light. Conjunctivae and EOM are normal.  Neck: Neck supple.  Cardiovascular: Normal rate, regular rhythm and normal heart sounds.   No murmur heard. Pulmonary/Chest: Effort normal and breath sounds normal. No respiratory distress.  Abdominal: Soft. Bowel sounds are normal. She exhibits no distension. There is no tenderness.  Musculoskeletal: She exhibits no edema.  Neurological:  She is alert and oriented to person, place, and time. She has normal reflexes. No cranial nerve deficit. She exhibits normal muscle tone.  Fluent speech, normal finger-to-nose testing, negative pronator drift, no clonus 5/5 strength and normal sensation x all 4 extremities  Skin: Skin is warm and dry.  Psychiatric: Judgment and thought content normal.  Mildly anxious  Nursing note and vitals reviewed.    ED Treatments / Results  Labs (all labs ordered are listed, but only abnormal results are displayed) Labs Reviewed  I-STAT BETA HCG BLOOD, ED ([facility], [facility], [facility] ONLY)    EKG  EKG Interpretation None       Radiology No results found.  Procedures Procedures (including critical care time)  Medications Ordered in ED Medications  sodium chloride 0.9 % bolus 1,000 mL (1,000 mLs Intravenous New Bag/Given 11/28/16 2226)  magnesium sulfate IVPB 2 g 50 mL (0 g Intravenous Stopped 11/28/16 2331)  ketorolac (TORADOL) 30 MG/ML injection 30 mg (30 mg Intravenous Given 11/28/16 2309)     Initial Impression / Assessment and Plan / ED Course  I have reviewed the triage vital signs and the nursing notes.  Pertinent labs that were available during my care of the patient were reviewed by me and considered in my medical decision making (see chart for details).     Pt w/ recurrent migraines, previous ED visits for the same, p/w ongoing headache pain not relieved by home medications. She was non-toxic on exam, normal VS. Normal neurologic exam. Her description is consistent with typical migraine and she has no alarming features or exam abnormalities to suggest acute intracranial process. Therefore, I explained that MRI is not indicated at this time but I think it is very important for her to follow as an outpatient with a neurologist for preventive therapy. She drove herself here and therefore I was very limited in what I could offer her in the ED but I gave her several nonsedating medications as part  of migraine treatments. She did not want steroids or several of the other commonly used medications.  On reexamination after the above medications, the patient was sleeping comfortably. She stated that her headache was much improved and felt comfortable going home. She has already scheduled an appointment with a headache specialist and I have emphasized the importance of this follow-up. Discussed supportive measures at home. Extensively reviewed return precautions. Patient voiced understanding and was discharged in satisfactory condition.  Final Clinical Impressions(s) / ED Diagnoses   Final diagnoses:  Migraine with status migrainosus, not intractable, unspecified migraine type    New Prescriptions New Prescriptions   No medications on file     Rudi Knippenberg, Ambrose Finlandachel Morgan, MD 11/29/16 0011

## 2016-11-28 NOTE — ED Triage Notes (Signed)
To ED for eval of recurrent migraine. States she has been seen for same but is now requesting an MRI instead of 'the cocktail'. Photophobia. Wearing sunglasses. States she took 4 Excedrin pta with little relief.

## 2016-11-29 NOTE — ED Notes (Signed)
Pt verbalizes understanding of d/c instructions. Pt ambulatory at d/c with all belongings.   
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
## 2022-06-29 ENCOUNTER — Emergency Department (HOSPITAL_COMMUNITY): Payer: Self-pay

## 2022-06-29 ENCOUNTER — Other Ambulatory Visit: Payer: Self-pay

## 2022-06-29 ENCOUNTER — Encounter (HOSPITAL_COMMUNITY): Payer: Self-pay

## 2022-06-29 ENCOUNTER — Emergency Department (HOSPITAL_COMMUNITY)
Admission: EM | Admit: 2022-06-29 | Discharge: 2022-06-30 | Disposition: A | Discharge status: Home / Self Care | Payer: Self-pay | Attending: Emergency Medicine | Admitting: Emergency Medicine

## 2022-06-29 DIAGNOSIS — Z91040 Latex allergy status: Secondary | ICD-10-CM | POA: Insufficient documentation

## 2022-06-29 DIAGNOSIS — Z23 Encounter for immunization: Secondary | ICD-10-CM | POA: Insufficient documentation

## 2022-06-29 DIAGNOSIS — T148XXA Other injury of unspecified body region, initial encounter: Secondary | ICD-10-CM

## 2022-06-29 DIAGNOSIS — S2231XA Fracture of one rib, right side, initial encounter for closed fracture: Secondary | ICD-10-CM | POA: Insufficient documentation

## 2022-06-29 DIAGNOSIS — S91331A Puncture wound without foreign body, right foot, initial encounter: Secondary | ICD-10-CM | POA: Insufficient documentation

## 2022-06-29 DIAGNOSIS — S90811A Abrasion, right foot, initial encounter: Secondary | ICD-10-CM | POA: Insufficient documentation

## 2022-06-29 DIAGNOSIS — R519 Headache, unspecified: Secondary | ICD-10-CM | POA: Insufficient documentation

## 2022-06-29 MED ORDER — TETANUS-DIPHTH-ACELL PERTUSSIS 5-2.5-18.5 LF-MCG/0.5 IM SUSY
0.5000 mL | PREFILLED_SYRINGE | Freq: Once | INTRAMUSCULAR | Status: AC
  Administered 2022-06-29: 0.5 mL via INTRAMUSCULAR
  Filled 2022-06-29: qty 0.5

## 2022-06-29 MED ORDER — ACETAMINOPHEN 500 MG PO TABS
1000.0000 mg | ORAL_TABLET | Freq: Once | ORAL | Status: AC
  Administered 2022-06-29: 1000 mg via ORAL
  Filled 2022-06-29: qty 2
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
## 2022-06-30 MED ORDER — CEPHALEXIN 500 MG PO CAPS: 500.0000 mg | ORAL_CAPSULE | Freq: Two times a day (BID) | ORAL | 0 refills | Status: AC

## 2022-06-30 MED ORDER — OXYCODONE-ACETAMINOPHEN 5-325 MG PO TABS: 1.0000 | ORAL_TABLET | Freq: Three times a day (TID) | ORAL | 0 refills | Status: AC | PRN

## 2022-06-30 NOTE — Discharge Instructions (Signed)
You have fractured your right rib, I have given you a incentive spirometer please use 3 times daily for next 3 weeks as to help prevent pneumonia.  I recommend over-the-counter pain medications.  I have given you oxycodone take as prescribed.  Please follow with your primary doctor in 3 weeks time for reassessment Puncture wound-you have a small wound on your foot, start antibiotics take as prescribed recommend basic wound care follow-up with your PCP as needed  I have given you a short course of narcotics please take as prescribed.  This medication can make you drowsy do not consume alcohol or operate heavy machinery when taking this medication.  This medication is Tylenol in it do not take Tylenol and take this medication.  Come back to the emergency department if you develop chest pain, shortness of breath, severe abdominal pain, uncontrolled nausea, vomiting, diarrhea.

## 2022-06-30 NOTE — ED Notes (Signed)
Discharge papers reviewed with pt. Dressing applied to foot. Instructed on how to use incentive spirometer. Pt ambulatory from ED
